# Patient Record
Sex: Female | Born: 1955 | Race: White | Hispanic: No | Smoking: Never smoker
Health system: Southern US, Community
[De-identification: ages and names within clinical notes are randomized; demographics above are authoritative.]

## PROBLEM LIST (undated history)

## (undated) DIAGNOSIS — Z780 Asymptomatic menopausal state: Secondary | ICD-10-CM

## (undated) DIAGNOSIS — M1990 Unspecified osteoarthritis, unspecified site: Secondary | ICD-10-CM

## (undated) DIAGNOSIS — E785 Hyperlipidemia, unspecified: Secondary | ICD-10-CM

## (undated) DIAGNOSIS — J349 Unspecified disorder of nose and nasal sinuses: Secondary | ICD-10-CM

## (undated) DIAGNOSIS — R112 Nausea with vomiting, unspecified: Secondary | ICD-10-CM

## (undated) DIAGNOSIS — Z9889 Other specified postprocedural states: Secondary | ICD-10-CM

## (undated) DIAGNOSIS — J302 Other seasonal allergic rhinitis: Secondary | ICD-10-CM

## (undated) DIAGNOSIS — R1011 Right upper quadrant pain: Secondary | ICD-10-CM

## (undated) HISTORY — DX: Unspecified osteoarthritis, unspecified site: M19.90

## (undated) HISTORY — DX: Morbid (severe) obesity due to excess calories: E66.01

## (undated) HISTORY — DX: Right upper quadrant pain: R10.11

## (undated) HISTORY — DX: Unspecified disorder of nose and nasal sinuses: J34.9

## (undated) HISTORY — PX: JOINT REPLACEMENT: SHX530

## (undated) HISTORY — DX: Asymptomatic menopausal state: Z78.0

## (undated) HISTORY — DX: Hyperlipidemia, unspecified: E78.5

## (undated) HISTORY — PX: TUBAL LIGATION: SHX77

## (undated) HISTORY — PX: TOTAL HIP ARTHROPLASTY: SHX124

---

## 2004-05-21 LAB — HM COLONOSCOPY: HM Colonoscopy: NORMAL

## 2005-04-06 ENCOUNTER — Ambulatory Visit: Payer: Self-pay | Admitting: Internal Medicine

## 2008-08-06 ENCOUNTER — Ambulatory Visit: Payer: Self-pay | Admitting: Family Medicine

## 2008-08-06 DIAGNOSIS — M79609 Pain in unspecified limb: Secondary | ICD-10-CM

## 2008-08-06 DIAGNOSIS — E785 Hyperlipidemia, unspecified: Secondary | ICD-10-CM

## 2008-08-06 DIAGNOSIS — M25559 Pain in unspecified hip: Secondary | ICD-10-CM

## 2008-08-06 LAB — CONVERTED CEMR LAB
Cholesterol, target level: 200 mg/dL
LDL Goal: 160 mg/dL

## 2008-10-02 LAB — HM MAMMOGRAPHY: HM Mammogram: NORMAL

## 2008-10-13 ENCOUNTER — Ambulatory Visit: Payer: Self-pay | Admitting: Family Medicine

## 2008-10-14 LAB — CONVERTED CEMR LAB
ALT: 26 units/L (ref 0–35)
AST: 22 units/L (ref 0–37)
Albumin: 4 g/dL (ref 3.5–5.2)
Alkaline Phosphatase: 63 units/L (ref 39–117)
Bilirubin, Direct: 0 mg/dL (ref 0.0–0.3)
CO2: 28 meq/L (ref 19–32)
Chloride: 112 meq/L (ref 96–112)
Cholesterol: 233 mg/dL — ABNORMAL HIGH (ref 0–200)
Creatinine, Ser: 0.9 mg/dL (ref 0.4–1.2)
GFR calc non Af Amer: 69.13 mL/min (ref >60)
Potassium: 4.2 meq/L (ref 3.5–5.1)
Sodium: 144 meq/L (ref 135–145)
Total Protein: 7.1 g/dL (ref 6.0–8.3)

## 2008-10-19 ENCOUNTER — Other Ambulatory Visit: Admission: RE | Admit: 2008-10-19 | Discharge: 2008-10-19 | Payer: Self-pay | Admitting: Family Medicine

## 2008-10-19 ENCOUNTER — Encounter: Payer: Self-pay | Admitting: Family Medicine

## 2008-10-19 ENCOUNTER — Ambulatory Visit: Payer: Self-pay | Admitting: Family Medicine

## 2008-10-20 ENCOUNTER — Encounter: Payer: Self-pay | Admitting: Family Medicine

## 2008-10-22 ENCOUNTER — Encounter (INDEPENDENT_AMBULATORY_CARE_PROVIDER_SITE_OTHER): Payer: Self-pay | Admitting: *Deleted

## 2008-11-02 ENCOUNTER — Encounter: Payer: Self-pay | Admitting: Family Medicine

## 2008-11-03 ENCOUNTER — Encounter: Payer: Self-pay | Admitting: Family Medicine

## 2008-11-08 ENCOUNTER — Encounter: Payer: Self-pay | Admitting: Family Medicine

## 2008-12-20 ENCOUNTER — Inpatient Hospital Stay (HOSPITAL_COMMUNITY): Admission: RE | Admit: 2008-12-20 | Discharge: 2008-12-22 | Payer: Self-pay | Admitting: Orthopedic Surgery

## 2009-02-01 ENCOUNTER — Ambulatory Visit: Payer: Self-pay | Admitting: Family Medicine

## 2009-02-03 LAB — CONVERTED CEMR LAB
Direct LDL: 159.5 mg/dL
HDL: 37.8 mg/dL — ABNORMAL LOW (ref >39.00)
Total CHOL/HDL Ratio: 6
Triglycerides: 127 mg/dL (ref 0.0–149.0)
VLDL: 25.4 mg/dL (ref 0.0–40.0)

## 2009-05-10 ENCOUNTER — Ambulatory Visit: Payer: Self-pay | Admitting: Family Medicine

## 2009-05-12 LAB — CONVERTED CEMR LAB
HDL: 40.8 mg/dL (ref >39.00)
Total CHOL/HDL Ratio: 4

## 2009-07-11 ENCOUNTER — Telehealth (INDEPENDENT_AMBULATORY_CARE_PROVIDER_SITE_OTHER): Payer: Self-pay | Admitting: *Deleted

## 2009-07-19 ENCOUNTER — Ambulatory Visit: Payer: Self-pay | Admitting: Family Medicine

## 2009-08-15 ENCOUNTER — Inpatient Hospital Stay (HOSPITAL_COMMUNITY): Admission: RE | Admit: 2009-08-15 | Discharge: 2009-08-17 | Payer: Self-pay | Admitting: Orthopedic Surgery

## 2009-09-25 ENCOUNTER — Ambulatory Visit: Payer: Self-pay | Admitting: Family Medicine

## 2009-10-21 ENCOUNTER — Ambulatory Visit: Payer: Self-pay | Admitting: Family Medicine

## 2009-10-21 LAB — CONVERTED CEMR LAB
ALT: 25 units/L (ref 0–35)
AST: 23 units/L (ref 0–37)
Albumin: 4.3 g/dL (ref 3.5–5.2)
Alkaline Phosphatase: 61 units/L (ref 39–117)
BUN: 20 mg/dL (ref 6–23)
Bilirubin, Direct: 0.2 mg/dL (ref 0.0–0.3)
CO2: 29 meq/L (ref 19–32)
Calcium: 9.3 mg/dL (ref 8.4–10.5)
Chloride: 107 meq/L (ref 96–112)
Cholesterol: 159 mg/dL (ref 0–200)
Creatinine, Ser: 0.8 mg/dL (ref 0.4–1.2)
GFR calc non Af Amer: 76.69 mL/min (ref >60)
Glucose, Bld: 90 mg/dL (ref 70–99)
HDL: 38 mg/dL — ABNORMAL LOW (ref >39.00)
LDL Cholesterol: 94 mg/dL (ref 0–99)
Potassium: 4.4 meq/L (ref 3.5–5.1)
Sodium: 143 meq/L (ref 135–145)
Total Bilirubin: 0.6 mg/dL (ref 0.3–1.2)
Total CHOL/HDL Ratio: 4
Total Protein: 6.8 g/dL (ref 6.0–8.3)
Triglycerides: 133 mg/dL (ref 0.0–149.0)
VLDL: 26.6 mg/dL (ref 0.0–40.0)

## 2009-10-26 ENCOUNTER — Ambulatory Visit: Payer: Self-pay | Admitting: Family Medicine

## 2009-10-26 DIAGNOSIS — M25569 Pain in unspecified knee: Secondary | ICD-10-CM

## 2009-10-26 DIAGNOSIS — M67919 Unspecified disorder of synovium and tendon, unspecified shoulder: Secondary | ICD-10-CM | POA: Insufficient documentation

## 2009-10-26 DIAGNOSIS — M719 Bursopathy, unspecified: Secondary | ICD-10-CM

## 2009-10-26 LAB — CONVERTED CEMR LAB

## 2009-10-26 LAB — HM PAP SMEAR

## 2010-05-10 ENCOUNTER — Ambulatory Visit: Payer: Self-pay | Admitting: Family Medicine

## 2010-05-10 DIAGNOSIS — R300 Dysuria: Secondary | ICD-10-CM | POA: Insufficient documentation

## 2010-05-10 DIAGNOSIS — N390 Urinary tract infection, site not specified: Secondary | ICD-10-CM

## 2010-05-10 LAB — CONVERTED CEMR LAB
Bilirubin Urine: NEGATIVE
Ketones, urine, test strip: NEGATIVE
Nitrite: NEGATIVE
Specific Gravity, Urine: 1.025
Urobilinogen, UA: 0.2
pH: 5

## 2010-05-11 ENCOUNTER — Encounter: Payer: Self-pay | Admitting: Family Medicine

## 2010-05-21 DIAGNOSIS — R1011 Right upper quadrant pain: Secondary | ICD-10-CM

## 2010-05-21 HISTORY — DX: Right upper quadrant pain: R10.11

## 2010-06-19 ENCOUNTER — Ambulatory Visit
Admission: RE | Admit: 2010-06-19 | Discharge: 2010-06-19 | Payer: Self-pay | Source: Home / Self Care | Attending: Family Medicine | Admitting: Family Medicine

## 2010-06-19 ENCOUNTER — Other Ambulatory Visit: Payer: Self-pay | Admitting: Family Medicine

## 2010-06-19 DIAGNOSIS — R1011 Right upper quadrant pain: Secondary | ICD-10-CM | POA: Insufficient documentation

## 2010-06-19 LAB — HEPATIC FUNCTION PANEL
Albumin: 4.2 g/dL (ref 3.5–5.2)
Alkaline Phosphatase: 65 U/L (ref 39–117)

## 2010-06-20 NOTE — Progress Notes (Signed)
Summary: needs another surgical clearance  Phone Note Call from Patient Call back at Home Phone [phone]   Caller: Patient Summary of Call: Pt states she had a hip replacement last summer and will be having another one at the end of march.  She will need another surgical clearance and is asking if she will need to come in for another appt.  She was last seen in 6/10. Initial call taken by: Lowella Petties CMA,  July 11, 2009 4:28 PM  Follow-up for Phone Call        yes given it has been 8 months since last OV she will need appt for preop eval ..Marland Kitchenplease have her schedule Follow-up by: Kerby Nora MD,  July 12, 2009 9:47 AM  Additional Follow-up for Phone Call Additional follow up Details #1::        Appt Scheduled Additional Follow-up by: Melody Comas,  July 12, 2009 10:33 AM

## 2010-06-20 NOTE — Assessment & Plan Note (Signed)
Summary: surgical clearance,wants flu shot/ alc   Vital Signs:  Patient profile:   55 year old female Height:      64 inches Weight:      249.8 pounds BMI:     43.03 Temp:     97.8 degrees F oral Pulse rate:   88 / minute Pulse rhythm:   regular BP sitting:   130 / 80  (left arm) Cuff size:   large  Vitals Entered By: Benny Lennert CMA Duncan Dull) (July 19, 2009 12:17 PM)  History of Present Illness: Chief complaint surgical clearence  Had right THR.Marland Kitchentolerated surgery well, inremarkable post op. Now has 3/28 surgery planned for left. Pain in right hip much better.  HAs gained weight, but unable to exercsie. Palns to work on this after surgery.   Problems Prior to Update: 1)  Routine Gynecological Examination  (ICD-V72.31) 2)  Physical Examination  (ICD-V70.0) 3)  Other Screening Mammogram  (ICD-V76.12) 4)  Hip Pain  (ICD-719.45) 5)  Heel Pain, Left  (ICD-729.5) 6)  Hyperlipidemia  (ICD-272.4)  Allergies (verified): No Known Drug Allergies  Past History:  Past medical, surgical, family and social histories (including risk factors) reviewed, and no changes noted (except as noted below).  Past Medical History: Reviewed history from 08/06/2008 and no changes required. Hyperlipidemia  Past Surgical History: Reviewed history from 08/06/2008 and no changes required. Denies surgical history  Family History: Reviewed history from 08/06/2008 and no changes required. father: lymphoma, lung cancer mother: high chol, osteoporosis brother: DM  MGM: colon cancer age 59s PGM: CAD  Social History: Reviewed history from 08/06/2008 and no changes required. 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 CV:  Denies chest pain or discomfort. Resp:   Denies shortness of breath. GI:  Denies abdominal pain, bloody stools, constipation, and diarrhea. GU:  Denies abnormal vaginal bleeding, discharge, and dysuria. Psych:  Denies anxiety and depression. Endo:  Denies cold intolerance, heat intolerance, and weight change.  Physical Exam  General:  obese appearing female in NAd Eyes:  No corneal or conjunctival inflammation noted. EOMI. Perrla. Funduscopic exam benign, without hemorrhages, exudates or papilledema. Vision grossly normal. Ears:  External ear exam shows no significant lesions or deformities.  Otoscopic examination reveals clear canals, tympanic membranes are intact bilaterally without bulging, retraction, inflammation or discharge. Hearing is grossly normal bilaterally. Nose:  External nasal examination shows no deformity or inflammation. Nasal mucosa are pink and moist without lesions or exudates. Mouth:  Oral mucosa and oropharynx without lesions or exudates.  Teeth in good repair. Small oropharynx Neck:  no carotid bruit or thyromegaly no cervical or supraclavicular lymphadenopathy  full ROM Lungs:  Normal respiratory effort, chest expands symmetrically. Lungs are clear to auscultation, no crackles or wheezes. Heart:  Normal rate and regular rhythm. S1 and S2 normal without gallop, murmur, click, rub or other extra sounds. Abdomen:  Bowel sounds positive,abdomen soft and non-tender without masses, organomegaly or hernias noted. Pulses:  R and L posterior tibial pulses are full and equal bilaterally  Extremities:  no edema  Skin:  Intact without suspicious lesions or rashes Psych:  Cognition and judgment appear intact. Alert and cooperative with normal attention span and concentration. No apparent delusions, illusions, hallucinations   Impression & Recommendations:  Problem # 1:  PREOPERATIVE EXAMINATION (ICD-V72.84)  High risk surgery in low risk pt. Weight and slow recovery time are her biggest risks. EKG and labs will be done  at hospital pre op. Tolerated last hip replacement well.   Problem # 2:  HYPERLIPIDEMIA (ICD-272.4) Improved.  Her updated medication list for this problem includes:    Simvastatin 40 Mg Tabs (Simvastatin) .Marland Kitchen... Take 1 tablet by mouth once a day  Complete Medication List: 1)  Multivitamins Tabs (Multiple vitamin) .... Take 1 tablet by mouth once a day 2)  Calcium Carbonate-vitamin D 600-400 Mg-unit Tabs (Calcium carbonate-vitamin d) .... Take 1 tablet by mouth once a day 3)  Vitamin C 500 Mg Tabs (Ascorbic acid) .... Take 1 tablet by mouth once a day 4)  Magnesium Gluconate 550 Mg Tabs (Magnesium gluconate) .... Take 1 tablet by mouth once a day 5)  Simvastatin 40 Mg Tabs (Simvastatin) .... Take 1 tablet by mouth once a day  Patient Instructions: 1)  Fasting lipids, CMET prior to CPX. Dx 272.0 2)  Scheduled CPX in 10/2009  Current Allergies (reviewed today): No known allergies   Appended Document: surgical clearance,wants flu shot/ alc .Flu Vaccine Consent Questions     Do you have a history of severe allergic reactions to this vaccine? no    Any prior history of allergic reactions to egg and/or gelatin? no    Do you have a sensitivity to the preservative Thimersol? no    Do you have a past history of Guillan-Barre Syndrome? no    Do you currently have an acute febrile illness? no    Have you ever had a severe reaction to latex? no    Vaccine information given and explained to patient? yes    Are you currently pregnant? no    Lot Number:[id number]   Exp Date:11/17/2009   Site Given  Left Deltoid IM    Clinical Lists Changes  Orders: Added new Service order of Admin 1st Vaccine (16109) - Signed Added new Service order of Flu Vaccine 73yrs + [phone]) - Signed Observations: Added new observation of FLU VAX VIS: 12/28/08 version (07/19/2009 14:55) Added new observation of FLU VAXLOT: [id number] (07/19/2009 14:55) Added new observation of FLU VAXMFR: [employer] (07/19/2009  14:55) Added new observation of FLU VAX EXP: 11/17/2009 (07/19/2009 14:55) Added new observation of FLU VAX DSE: 0.47ml (07/19/2009 14:55) Added new observation of FLU VAX: Fluvax 3+ (07/19/2009 14:55)

## 2010-06-20 NOTE — Assessment & Plan Note (Signed)
Summary: CPX /PAP???/RBH   Vital Signs:  Patient profile:   55 year old female Height:      64 inches Weight:      245.2 pounds BMI:     42.24 Temp:     97.8 degrees F oral Pulse rate:   88 / minute Pulse rhythm:   regular BP sitting:   120 / 86  (left arm) Cuff size:   large  Vitals Entered By: Benny Lennert CMA Duncan Dull) (October 26, 2009 11:12 AM)  History of Present Illness: Chief complaint cpx/pap  The patient is here for annual wellness exam and preventative care.     Has improved s/p B hip replacement.  Has completed rehab.  Sore in left medial knee x 3 weeks, using ice and ibuprofen. improving gradually. Wearing a neoprene brace intermittantly.  In last 3 months... sore in upper arm when lifting above head. Only note when moving arm.   Lipid Management History:      Positive NCEP/ATP III risk factors include female age 29 years old or older and HDL cholesterol less than 40.  Negative NCEP/ATP III risk factors include non-tobacco-user status.        Adjunctive measures started by the patient include aerobic exercise, fiber, ASA, folic acid, omega-3 supplements, limit alcohol consumpton, and weight reduction.  She expresses no side effects from her lipid-lowering medication.  The patient denies any symptoms to suggest myopathy or liver disease.     Problems Prior to Update: 1)  Preoperative Examination  (ICD-V72.84) 2)  Routine Gynecological Examination  (ICD-V72.31) 3)  Physical Examination  (ICD-V70.0) 4)  Other Screening Mammogram  (ICD-V76.12) 5)  Hip Pain  (ICD-719.45) 6)  Heel Pain, Left  (ICD-729.5) 7)  Hyperlipidemia  (ICD-272.4)  Current Medications (verified): 1)  Multivitamins   Tabs (Multiple Vitamin) .... Take 1 Tablet By Mouth Once A Day 2)  Calcium Carbonate-Vitamin D 600-400 Mg-Unit  Tabs (Calcium Carbonate-Vitamin D) .... Take 1 Tablet By Mouth Once A Day 3)  Vitamin C 500 Mg  Tabs (Ascorbic Acid) .... Take 1 Tablet By Mouth Once A Day 4)  Magnesium  Gluconate 550 Mg Tabs (Magnesium Gluconate) .... Take 1 Tablet By Mouth Once A Day 5)  Simvastatin 40 Mg Tabs (Simvastatin) .... Take 1 Tablet By Mouth Once A Day 6)  Fluticasone Propionate 50 Mcg/act Susp (Fluticasone Propionate) .Marland Kitchen.. 1 Sprays Per Nostril Daily 7)  Diclofenac Sodium 75 Mg Tbec (Diclofenac Sodium) .Marland Kitchen.. 1 Tab By Mouth Two Times A Day  Allergies (verified): No Known Drug Allergies  Past History:  Past medical, surgical, family and social histories (including risk factors) reviewed, and no changes noted (except as noted below).  Past Medical History: Reviewed history from 08/06/2008 and no changes required. Hyperlipidemia  Past Surgical History: Reviewed history from 08/06/2008 and no changes required. Denies surgical history  Family History: Reviewed history from 08/06/2008 and no changes required. father: lymphoma, lung cancer mother: high chol, osteoporosis brother: DM  MGM: colon cancer age 71s PGM: CAD  Social History: Reviewed history from 08/06/2008 and no changes required. 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 CV:  Denies chest pain or discomfort. Resp:  Denies shortness of breath. GI:  Denies abdominal pain, bloody stools, constipation, and diarrhea. GU:  Denies abnormal vaginal  bleeding and dysuria. Derm:  Denies lesion(s) and rash. Psych:  Denies anxiety and depression.  Physical Exam  General:  obese appearing female in NAD Eyes:  No corneal or conjunctival inflammation noted. EOMI. Perrla. Funduscopic exam benign, without hemorrhages, exudates or papilledema. Vision grossly normal. Ears:  External ear exam shows no significant lesions or deformities.  Otoscopic examination reveals clear canals,  tympanic membranes are intact bilaterally without bulging, retraction, inflammation or discharge. Hearing is grossly normal bilaterally. Nose:  External nasal examination shows no deformity or inflammation. Nasal mucosa are pink and moist without lesions or exudates. Mouth:  Oral mucosa and oropharynx without lesions or exudates.  Teeth in good repair. Neck:  no carotid bruit or thyromegaly no cervical or supraclavicular lymphadenopathy  Chest Wall:  No deformities, masses, or tenderness noted. Breasts:  No mass, nodules, thickening, tenderness, bulging, retraction, inflamation, nipple discharge or skin changes noted.   Lungs:  Normal respiratory effort, chest expands symmetrically. Lungs are clear to auscultation, no crackles or wheezes. Heart:  Normal rate and regular rhythm. S1 and S2 normal without gallop, murmur, click, rub or other extra sounds. Abdomen:  Bowel sounds positive,abdomen soft and non-tender without masses, organomegaly or hernias noted. Rectal:  no external abnormalities and no hemorrhoids.   Genitalia:  normal introitus, no external lesions, no vaginal discharge, mucosa pink and moist, no vaginal or cervical lesions, no vaginal atrophy, normal uterus size and position, and no adnexal masses or tenderness.   Msk:  ttp left lateral shoulder...over bursa, full strenght in RCM.Marland Kitchenneg impingement test left knee..ttp over medial joint line, neg ant/post drawer,intact  B collateral ligament ,  no crepitus, full ROM, no swelling, no redness Pulses:  R and L posterior tibial pulses are full and equal bilaterally  Extremities:  no edema Skin:  Intact without suspicious lesions or rashes   Impression & Recommendations:  Problem # 1:  PHYSICAL EXAMINATION (ICD-V70.0) The patient's preventative maintenance and recommended screening tests for an annual wellness exam were reviewed in full today. Brought up to date unless services declined.  Counselled on the importance of diet, exercise,  and its role in overall health and mortality. The patient's FH and SH was reviewed, including their home life, tobacco status, and drug and alcohol status.     Problem # 2:  ROUTINE GYNECOLOGICAL EXAMINATION (ICD-V72.31) No pap, nml DVE.   Problem # 3:  BURSITIS, LEFT SHOULDER (ICD-726.10) Treat with execises, ice, NSAIDs. Follow up if not improivng in 2 weeks or to [facility] as scheduled.   Problem # 4:  KNEE PAIN, LEFT (ICD-719.46) No clear ligament tear...possible medical collateral ligament strain vs osteoarthritis..Weight loss, NSAIDs, Stretching exercises.  Her updated medication list for this problem includes:    Diclofenac Sodium 75 Mg Tbec (Diclofenac sodium) .Marland Kitchen... 1 tab by mouth two times a day  Complete Medication List: 1)  Multivitamins Tabs (Multiple vitamin) .... Take 1 tablet by mouth once a day 2)  Calcium Carbonate-vitamin D 600-400 Mg-unit Tabs (Calcium carbonate-vitamin d) .... Take 1 tablet by mouth once a day 3)  Vitamin C 500 Mg Tabs (Ascorbic acid) .... Take 1 tablet by mouth once a day 4)  Magnesium Gluconate 550 Mg Tabs (Magnesium gluconate) .... Take 1 tablet by mouth once a day 5)  Simvastatin 40 Mg Tabs (Simvastatin) .... Take 1 tablet by mouth once a day 6)  Fluticasone Propionate 50 Mcg/act Susp (Fluticasone propionate) .Marland Kitchen.. 1 sprays per nostril daily 7)  Diclofenac Sodium 75 Mg Tbec (Diclofenac sodium) .Marland KitchenMarland KitchenMarland Kitchen 1  tab by mouth two times a day  Other Orders: Radiology Referral (Radiology)  Lipid Assessment/Plan:      Based on NCEP/ATP III, the patient's risk factor category is "2 or more risk factors and a calculated 10 year CAD risk of < 20%".  The patient's lipid goals are as follows: Total cholesterol goal is 200; LDL cholesterol goal is 160; HDL cholesterol goal is 40; Triglyceride goal is 150.  Her LDL cholesterol goal has been met.    Patient Instructions: 1)  Referral Appointment Information 2)  Day/Date: 3)  Time: 4)  Place/MD: 5)  Address: 6)   Phone/Fax: 7)  Patient given appointment information. Information/Orders faxed/mailed.  8)  Use diclofenac for shoulder and left knee. Gentle stretches. 9)  Follow up if not improving in 2 weeks.  Prescriptions: DICLOFENAC SODIUM 75 MG TBEC (DICLOFENAC SODIUM) 1 tab by mouth two times a day  #30 x 0   Entered and Authorized by:   Kerby Nora MD   Signed by:   Kerby Nora MD on 10/26/2009   Method used:   [facility] to        [facility]  [address] [phone]retail)       [address]       [place], [place]  [address]       Ph: [phone]       Fax: [phone]   RxID:   [id number]   Current Allergies (reviewed today): No known allergies   Colonoscopy Result Date:  05/21/2004 Colonoscopy Result:  normal Colonoscopy Next Due:  10 yr Last PAP:  NEGATIVE FOR INTRAEPITHELIAL LESIONS OR MALIGNANCY. (10/19/2008 12:00:00 AM) PAP Result Date:  10/26/2009 PAP Result:  DVE, pap q2 years PAP Next Due:  1 yr

## 2010-06-22 ENCOUNTER — Ambulatory Visit: Payer: Self-pay | Admitting: Family Medicine

## 2010-06-22 ENCOUNTER — Encounter: Payer: Self-pay | Admitting: Family Medicine

## 2010-06-22 NOTE — Assessment & Plan Note (Signed)
Summary: ?UTI/CLE   Vital Signs:  Patient profile:   55 year old female Height:      64 inches Weight:      239.0 pounds BMI:     41.17 Temp:     98.1 degrees F oral Pulse rate:   88 / minute Pulse rhythm:   regular BP sitting:   120 / 84  (left arm) Cuff size:   large  Vitals Entered By: Benny Lennert CMA Duncan Dull) (May 10, 2010 11:55 AM)  History of Present Illness: Chief complaint ? uti  burning, urgency with urination. no n/v/d, no flank pain  rare UTI  REVIEW OF SYSTEMS GEN: Acute illness details above. CV: No chest pain or SOB GI: No noted N or V Otherwise, pertinent positives and negatives are noted in the HPI.   GEN: WDWN, A&Ox4,NAD. Non-toxic HEENT: Atraumatic, normocephalic. CV: RRR, No M/G/R PULM: CTA B, No wheezes, crackles, or rhonchi ABD: S, NT, ND, +BS, no rebound. No CVAT. + suprapubic tenderness. EXT: No c/c/e  Allergies (verified): No Known Drug Allergies   Impression & Recommendations:  Problem # 1:  UTI (ICD-599.0) Assessment New  Her updated medication list for this problem includes:    Nitrofurantoin Monohyd Macro 100 Mg Caps (Nitrofurantoin monohyd macro) .Marland Kitchen... 1 by mouth two times a day  Encouraged to push clear liquids, get enough rest, and take acetaminophen as needed. To be seen in 10 days if no improvement, sooner if worse.  Orders: T-Culture, Urine ([id number])  Problem # 2:  DYSURIA (ICD-788.1)  Her updated medication list for this problem includes:    Nitrofurantoin Monohyd Macro 100 Mg Caps (Nitrofurantoin monohyd macro) .Marland Kitchen... 1 by mouth two times a day  Orders: UA Dipstick w/o Micro (manual) (41324)  Complete Medication List: 1)  Multivitamins Tabs (Multiple vitamin) .... Take 1 tablet by mouth once a day 2)  Calcium Carbonate-vitamin D 600-400 Mg-unit Tabs (Calcium carbonate-vitamin d) .... Take 1 tablet by mouth once a day 3)  Vitamin C 500 Mg Tabs (Ascorbic acid) .... Take 1 tablet by mouth once a day 4)   Magnesium Gluconate 550 Mg Tabs (Magnesium gluconate) .... Take 1 tablet by mouth once a day 5)  Simvastatin 40 Mg Tabs (Simvastatin) .... Take 1 tablet by mouth once a day 6)  Fluticasone Propionate 50 Mcg/act Susp (Fluticasone propionate) .Marland Kitchen.. 1 sprays per nostril daily 7)  Fish Oil 1000 Mg Caps (Omega-3 fatty acids) .... Take 2 tablets once daily 8)  Nitrofurantoin Monohyd Macro 100 Mg Caps (Nitrofurantoin monohyd macro) .Marland Kitchen.. 1 by mouth two times a day Prescriptions: NITROFURANTOIN MONOHYD MACRO 100 MG CAPS (NITROFURANTOIN MONOHYD MACRO) 1 by mouth two times a day  #14 x 0   Entered and Authorized by:   Hannah Beat MD   Signed by:   Hannah Beat MD on 05/10/2010   Method used:   [facility]        [phone][facility] (retail)       [address]       [place], [place]  [address]       Ph: [phone]       Fax: [phone]   RxID:   [id number]    Orders Added: 1)  UA Dipstick w/o Micro (manual) [81002] 2)  T-Culture, Urine [[id number]]    Current Allergies (reviewed today): No known allergies   Laboratory Results   Urine Tests  Date/Time Received: May 10, 2010 12:02  PM  Date/Time Reported: May 10, 2010 12:02 PM   Routine Urinalysis   Color: yellow Appearance: Cloudy Glucose: negative   (Normal Range: Negative) Bilirubin: negative   (Normal Range: Negative) Ketone: negative   (Normal Range: Negative) Spec. Gravity: 1.025   (Normal Range: 1.003-1.035) Blood: negative   (Normal Range: Negative) pH: 5.0   (Normal Range: 5.0-8.0) Protein: trace   (Normal Range: Negative) Urobilinogen: 0.2   (Normal Range: 0-1) Nitrite: negative   (Normal Range: Negative) Leukocyte Esterace: small   (Normal Range: Negative)

## 2010-06-28 NOTE — Assessment & Plan Note (Signed)
Summary: SWELLING AROUND RIBCAGE/CLE   Vital Signs:  Patient profile:   55 year old female Height:      64 inches Weight:      212 pounds BMI:     36.52 Temp:     97.6 degrees F oral Pulse rate:   84 / minute Pulse rhythm:   regular BP sitting:   144 / 80  (left arm) Cuff size:   large  Vitals Entered By: Delilah Shan CMA Tabbatha Bordelon Dull) (June 19, 2010 12:13 PM) CC: Swelling around rib cage   History of Present Illness: Tenderness under R anterior ribs. Inc in gas, sense of fullness after eating.  Feeling bloated.  Area is occ puffy.  She had some cramping in the area, intermittently.  Overall increase is symptoms in last few weeks.    No h/o abdominal surgery.  No FCNAVD.  Occ frequent stools in AM but no diarrhea.    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  Otherwise negative.    Physical Exam  General:  no apparent distress normocephalic atraumatic mucous membranes moist neck supple regular rate and rhythm clear to auscultation bilaterally abdomen soft, not tender to palpation except for pos murphy sign.  no rebound.  normal bs skin w/o jaundice.  ext well perfused.    Impression & Recommendations:  Problem # 1:  RUQ PAIN (ICD-789.01) Likely GB source.  Nontoxic.  Will  check labs and set up u/s.  If pos, refer to [facility].  If increase in symptoms in meantime, to ER.  Anatomy d/w patient.  She agrees, understands.  Okay for outpatient follow up.  Told to avoid fatty foods.  Orders: TLB-Hepatic/Liver Function Pnl (80076-HEPATIC) TLB-Lipase (83690-LIPASE) Radiology Referral (Radiology)  Complete Medication List: 1)  Multivitamins Tabs (Multiple vitamin) .... Take 1 tablet by mouth once a day 2)  Calcium Carbonate-vitamin D 600-400 Mg-unit Tabs (Calcium carbonate-vitamin d) .... Take 1 tablet by mouth once a day 3)  Vitamin C 500 Mg Tabs (Ascorbic acid) .... Take 1 tablet by mouth once a day 4)  Magnesium Gluconate 550 Mg Tabs (Magnesium gluconate) .... Take 1 tablet by mouth once a day 5)  Simvastatin 40 Mg Tabs (Simvastatin) .... Take 1 tablet by mouth once a day 6)  Fluticasone Propionate 50 Mcg/act Susp (Fluticasone propionate) .Marland Kitchen.. 1 sprays per nostril daily 7)  Fish Oil 1000 Mg Caps (Omega-3 fatty acids) .... Take 2 tablets once daily 8)  Vitamin D 1000 Unit Tabs (Cholecalciferol) .... Take 1 tablet by mouth once a day  Patient Instructions: 1)  You can get your results through our phone system.  Follow the instructions on the blue card.  2)  See Shirlee Limerick about your referral before your leave today.  3)  If your pain get much worse, then go to the ER. 4)  Avoid fatty foods in the meantime.  5)  Take care.    Orders Added: 1)  Est. Patient Level IV [46962] 2)  TLB-Hepatic/Liver Function Pnl [80076-HEPATIC] 3)  TLB-Lipase [83690-LIPASE] 4)  Radiology Referral [Radiology]    Current Allergies (reviewed today): No known allergies

## 2010-07-05 ENCOUNTER — Encounter: Payer: Self-pay | Admitting: Family Medicine

## 2010-07-05 ENCOUNTER — Ambulatory Visit: Payer: Self-pay | Admitting: Family Medicine

## 2010-08-14 LAB — URINALYSIS, ROUTINE W REFLEX MICROSCOPIC
Glucose, UA: NEGATIVE mg/dL
Hgb urine dipstick: NEGATIVE
Specific Gravity, Urine: 1.018 (ref 1.005–1.030)
pH: 7 (ref 5.0–8.0)

## 2010-08-14 LAB — BASIC METABOLIC PANEL
BUN: 13 mg/dL (ref 6–23)
CO2: 26 mEq/L (ref 19–32)
CO2: 27 mEq/L (ref 19–32)
Calcium: 8.1 mg/dL — ABNORMAL LOW (ref 8.4–10.5)
Calcium: 9.4 mg/dL (ref 8.4–10.5)
Chloride: 104 mEq/L (ref 96–112)
Creatinine, Ser: 0.73 mg/dL (ref 0.4–1.2)
Creatinine, Ser: 0.8 mg/dL (ref 0.4–1.2)
GFR calc Af Amer: >60 mL/min (ref >60)
GFR calc non Af Amer: >60 mL/min (ref >60)
Glucose, Bld: 119 mg/dL — ABNORMAL HIGH (ref 70–99)
Glucose, Bld: 92 mg/dL (ref 70–99)
Sodium: 140 mEq/L (ref 135–145)

## 2010-08-14 LAB — CBC
Hemoglobin: 11.3 g/dL — ABNORMAL LOW (ref 12.0–15.0)
Hemoglobin: 12.1 g/dL (ref 12.0–15.0)
MCHC: 35 g/dL (ref 30.0–36.0)
MCV: 91 fL (ref 78.0–100.0)
RBC: 3.67 MIL/uL — ABNORMAL LOW (ref 3.87–5.11)
RBC: 3.78 MIL/uL — ABNORMAL LOW (ref 3.87–5.11)
RBC: 4.9 MIL/uL (ref 3.87–5.11)
RDW: 13.2 % (ref 11.5–15.5)
WBC: 6.7 10*3/uL (ref 4.0–10.5)
WBC: 8.6 10*3/uL (ref 4.0–10.5)

## 2010-08-14 LAB — DIFFERENTIAL
Eosinophils Absolute: 0.2 10*3/uL (ref 0.0–0.7)
Eosinophils Relative: 4 % (ref 0–5)
Lymphocytes Relative: 38 % (ref 12–46)
Lymphs Abs: 2.5 10*3/uL (ref 0.7–4.0)
Monocytes Absolute: 0.5 10*3/uL (ref 0.1–1.0)

## 2010-08-14 LAB — URINE MICROSCOPIC-ADD ON

## 2010-08-14 LAB — TYPE AND SCREEN
ABO/RH(D): B POS
Antibody Screen: NEGATIVE

## 2010-08-26 LAB — BASIC METABOLIC PANEL
BUN: 7 mg/dL (ref 6–23)
BUN: 8 mg/dL (ref 6–23)
Calcium: 8.5 mg/dL (ref 8.4–10.5)
Chloride: 105 mEq/L (ref 96–112)
Chloride: 106 mEq/L (ref 96–112)
Creatinine, Ser: 0.69 mg/dL (ref 0.4–1.2)
Creatinine, Ser: 0.8 mg/dL (ref 0.4–1.2)
GFR calc Af Amer: >60 mL/min (ref >60)
GFR calc non Af Amer: >60 mL/min (ref >60)
Glucose, Bld: 124 mg/dL — ABNORMAL HIGH (ref 70–99)
Potassium: 4.5 mEq/L (ref 3.5–5.1)

## 2010-08-26 LAB — CBC
HCT: 35.1 % — ABNORMAL LOW (ref 36.0–46.0)
MCHC: 34.2 g/dL (ref 30.0–36.0)
MCV: 91.4 fL (ref 78.0–100.0)
MCV: 91.8 fL (ref 78.0–100.0)
Platelets: 178 10*3/uL (ref 150–400)
Platelets: 188 10*3/uL (ref 150–400)
RBC: 3.65 MIL/uL — ABNORMAL LOW (ref 3.87–5.11)
RDW: 14.1 % (ref 11.5–15.5)
WBC: 7.5 10*3/uL (ref 4.0–10.5)
WBC: 7.9 10*3/uL (ref 4.0–10.5)

## 2010-08-26 LAB — ABO/RH: ABO/RH(D): B POS

## 2010-08-26 LAB — TYPE AND SCREEN

## 2010-08-27 LAB — DIFFERENTIAL
Basophils Relative: 2 % — ABNORMAL HIGH (ref 0–1)
Eosinophils Absolute: 0.4 10*3/uL (ref 0.0–0.7)
Lymphs Abs: 2.1 10*3/uL (ref 0.7–4.0)
Monocytes Absolute: 0.4 10*3/uL (ref 0.1–1.0)
Monocytes Relative: 7 % (ref 3–12)
Neutro Abs: 2.6 10*3/uL (ref 1.7–7.7)
Neutrophils Relative %: 47 % (ref 43–77)

## 2010-08-27 LAB — BASIC METABOLIC PANEL
CO2: 25 mEq/L (ref 19–32)
Chloride: 112 mEq/L (ref 96–112)
Creatinine, Ser: 0.83 mg/dL (ref 0.4–1.2)
GFR calc Af Amer: >60 mL/min (ref >60)
Sodium: 142 mEq/L (ref 135–145)

## 2010-08-27 LAB — CBC
Hemoglobin: 14.2 g/dL (ref 12.0–15.0)
MCHC: 34.3 g/dL (ref 30.0–36.0)
MCV: 90.4 fL (ref 78.0–100.0)
RBC: 4.58 MIL/uL (ref 3.87–5.11)
WBC: 5.5 10*3/uL (ref 4.0–10.5)

## 2010-08-27 LAB — URINALYSIS, ROUTINE W REFLEX MICROSCOPIC
Nitrite: NEGATIVE
Specific Gravity, Urine: 1.022 (ref 1.005–1.030)
Urobilinogen, UA: 0.2 mg/dL (ref 0.0–1.0)
pH: 6.5 (ref 5.0–8.0)

## 2010-08-27 LAB — APTT: aPTT: 26 seconds (ref 24–37)

## 2010-08-27 LAB — PROTIME-INR: INR: 1 (ref 0.00–1.49)

## 2010-10-03 NOTE — H&P (Signed)
[name], Lowery                ACCOUNT NO.:  [id number]   MEDICAL RECORD NO.:  [id number]          PATIENT TYPE:  INP   LOCATION:                               FACILITY:  [facility]   PHYSICIAN:  Madlyn Frankel. Charlann Boxer, M.D.  DATE OF BIRTH:  1955-07-10   DATE OF ADMISSION:  12/20/2008  DATE OF DISCHARGE:                              HISTORY & PHYSICAL   PROCEDURE:  Right total hip replacement.   CHIEF COMPLAINTS:  Right hip pain.   HISTORY OF PRESENT ILLNESS:  A 55 year old female with a history of  right hip pain secondary to osteoarthritis.  It has been refractory to  all conservative treatment.   PRIMARY CARE PHYSICIAN:  Dr. Kerby Nora.   PAST MEDICAL HISTORY:  1. Osteoarthritis.  2. Dyslipidemia.   PREVIOUS SURGERIES:  None.   FAMILY HISTORY:  Cancer, lymphoma, osteoarthritis, heart disease.   SOCIAL HISTORY:  Divorced, retired, nonsmoker.  Has primary caregiver  postoperatively in the home.   DRUG ALLERGIES:  NO KNOWN DRUG ALLERGIES.   MEDICATIONS:  1. Multivitamin daily.  2. Calcium plus vitamin D p.o. daily.  3. Vitamin C 500 mg p.o. daily.  4. Magnesium 550 mg p.o. daily.  5. Diclofenac 75 mg one p.o. daily.   REVIEW OF SYSTEMS:  HEENT:  She has intermittent headaches.  GASTROINTESTINAL:  Heartburn.  MUSCULOSKELETAL:  She has morning  stiffness.  Otherwise see HPI.   PHYSICAL EXAMINATION:  VITAL SIGNS:  Pulse 72, respirations 16, blood  pressure 142/90.  GENERAL:  Awake, alert and oriented.  HEENT:  Normocephalic.  NECK:  Supple.  No carotid bruits.  CHEST:  Lungs clear to auscultation bilaterally.  BREASTS:  Deferred.  HEART:  S1-S2 distinct.  ABDOMEN:  Soft, nontender, bowel sounds present.  PELVIS:  Stable.  GENITOURINARY:  Deferred.  EXTREMITIES:  Right hip has decreased range of motion and increased pain  with weightbearing.  SKIN:  No cellulitis.  NEUROLOGIC:  Intact distal sensibilities.   LABORATORY DATA:  Labs, EKG, chest x-ray all pending  presurgical  testing.   IMPRESSION:  Right hip osteoarthritis.   PLAN OF ACTION:  Right total hip replacement by Dr. Charlann Boxer at [facility]  on December 20, 2008.  Risks and complications were discussed.  Postoperative medications were provided including aspirin for DVT  prophylaxis.     ______________________________  Yetta Glassman [address], [place]      Madlyn Frankel. Charlann Boxer, M.D.  Electronically Signed    BLM/MEDQ  D:  12/08/2008  T:  12/08/2008  Job:  [id number]   cc:   Kerby Nora, MD

## 2010-10-03 NOTE — Op Note (Signed)
NAMEONYINYECHI, HUANTE                ACCOUNT NO.:  [id number]   MEDICAL RECORD NO.:  [id number]          PATIENT TYPE:  INP   LOCATION:  0004                         FACILITY:  [facility]   PHYSICIAN:  Madlyn Frankel. Charlann Boxer, M.D.  DATE OF BIRTH:  September 09, 1955   DATE OF PROCEDURE:  12/20/2008  DATE OF DISCHARGE:                               OPERATIVE REPORT   PREOPERATIVE DIAGNOSIS:  Right hip osteoarthritis.   POSTOPERATIVE DIAGNOSIS:  Right hip osteoarthritis.   PROCEDURE:  Right total hip replacement.   COMPONENTS USED:  DePuy hip system, size 50 Pinnacle cup, a size 3 high  Tri-Lock stem with a 36, 1.5 ball with a metal-on-metal system.   SURGEON:  Madlyn Frankel. Charlann Boxer, M.D.   ASSISTANT:  Yetta Glassman. Mann, PA.   ANESTHESIA:  Spinal.   SPECIMENS:  None.   COMPLICATIONS:  None.   DRAINS:  One Hemovac.   ESTIMATED BLOOD LOSS:  Approximately 400 mL.   INDICATIONS FOR PROCEDURE:  Ms. Swann is a 55 year old female who  presented to the office for hip complaints.  Radiographs from the  outside indicated end-stage bone-on-bone arthritis of the right hip and  moderate degenerative changes of the left hip.   She had a limited range of motion, decreased quality of life based on  her limited functional ability with the pain.  Based on her radiographic  appearance, I did feel that any conservative measures would be of any  benefit for her longterm.  We discussed hip replacement surgery.  The  risk of infection, DVT, component failure, dislocation were all  discussed and reviewed.  Consent was obtained for the benefit of pain  relief.   PROCEDURE IN DETAIL:  The patient was brought to the operative theater.  Once adequate anesthesia, preoperative antibiotics, Ancef administered  the patient was positioned in the left lateral decubitus position with  her right-side up.  The right lower extremity was then pre-scrubbed,  prepped and draped in sterile fashion.   The time-out was performed  identifying the patient, planned procedure  and the extremity.  The lateral based incision was made for a posterior  approach to the hip.  Sharp dissection was carried down to the  iliotibial band and gluteal fascia.  The iliotibial band was split  posteriorly for posterior approach.  The short external rotators and  posterior capsule were taken down as a single layer.  The hip was  dislocated.  A neck osteotomy was made based off anatomic landmarks and  evaluating preoperative radiographs.  I attended to the femur first,  opening the proximal canal with a box osteotome to assure that was  lateral enough on the neck and then used a drill and then the hand  reamer once.  I then irrigated to prevent fat emboli.  I began broaching  with zero broach setting the femoral anteversion about 20 degrees.  I  broached up to a size 2 and then used a calcar planer to finish off the  neck cut and then went to a 3.  The 3 broach sat about a mm proud of the  neck cut.  With this, I went ahead and attended to the acetabulum.  Acetabular exposure was obtained.  The labrum was removed.  I began  reaming with a 45 reamer and reamed up to 49 reamer with good bony bed  preparation.  The final 50 Pinnacle cup was the impacted and it was  impacted at approximately 35-48 of abduction and 20 degrees of full  flexion, anatomically positioned inferior to the anterior rim of the  acetabulum, with a portion of the cup exposed superior lateral.   Given these parameters a single cancellous screw was placed and the  trial liner placed.  Trial reduction was now carried out and 36, +1.5,  the 3 broach and high neck based on the radiographs.  The leg lengths  appeared to be comparable to the down leg when I positioned the patient.  There was no evidence of impingement up to 70- 80 degrees of internal  rotation with neutral abduction and flexion to 80 degrees.  Given these  parameters, all trial components were removed and I  placed a central  hole eliminator.  The final 36 metal liner was impacted onto a dried  shell.   The final 3 high Tri-Lock stem was then opened.  It was impacted.  It  sat at about the level where the broach was and based on this a trial  reduction of 36, 1.5 ball was utilized.  This was impacted onto clean  and dry trunnion.  The hip was re-reduced and the hip had irrigated  throughout the case and again at this point.  I then reapproximated the  posterior capsule and short external rotator tissue with #1 Vicryl back  to the superior capsular tissues.  Then placed a medium Hemovac drain  deep.  The remainder of the wound was closed in layers with a #1 Vicryl  in the iliotibial band and gluteal fascia, 2-0 in the subcutaneous layer  and 4-0 running Monocryl.  The hip was cleaned, dried and dressed  sterilely with Steri-Strips and a sterile wrap.  She was brought to the  recovery room in stable condition.      Madlyn Frankel Charlann Boxer, M.D.  Electronically Signed     MDO/MEDQ  D:  12/20/2008  T:  12/20/2008  Job:  [id number]

## 2010-10-06 NOTE — Discharge Summary (Signed)
Elizabeth Lowery, Elizabeth Lowery                ACCOUNT NO.:  [id number]   MEDICAL RECORD NO.:  [id number]          PATIENT TYPE:  INP   LOCATION:  1620                         FACILITY:  [facility]   PHYSICIAN:  Madlyn Frankel. Charlann Boxer, M.D.  DATE OF BIRTH:  August 12, 1955   DATE OF ADMISSION:  12/20/2008  DATE OF DISCHARGE:  12/22/2008                               DISCHARGE SUMMARY   ADMITTING DIAGNOSES:  1. Osteoarthritis.  2. Dyslipidemia.   DISCHARGE DIAGNOSES:  1. Osteoarthritis.  2. Dyslipidemia.   HISTORY OF PRESENT ILLNESS:  A 55 year old female with a history of  right hip pain secondary to osteoarthritis refractory to all  conservative treatment.   CONSULTS:  None.   PROCEDURE:  A right total hip replacement by surgeon, Dr. Durene Romans.  Assistant, Dwyane Luo, P.A.-C.   LABORATORY DATA:  CBC, final reading, white blood cells 7.9, hemoglobin  11.4, hematocrit 33.5, platelets 178.  Metabolic, sodium 130, potassium  4.4, BUN 7, creatinine 0.69, glucose 122.   HOSPITAL COURSE:  Patient admitted to the hospital then underwent a  right total hip replacement, tolerated procedure well, and was admitted  to the orthopedic floor.  Her stay was unremarkable.  She remained  hemodynamically and orthopedically stable throughout her course of stay.  Day two was seen and was stable, afebrile, hemodynamically stable.  Dressing was clean, dry, with no significant drainage from the wound.  She was neurovascularly intact to right lower extremity.  She was  weightbearing as tolerated and making progress with her physical  therapy.  Dressing was changed and she was ready for discharge home.   DISCHARGE DISPOSITION:  Discharged home in stable and improved  condition.   DISCHARGE PHYSICAL THERAPY:  Weightbearing as tolerated with use of  rolling walker.   DISCHARGE DIET:  Heart healthy.   DISCHARGE WOUND CARE:  Keep dry.   DISCHARGE MEDICATIONS:  1. Aspirin 325 mg one p.o. b.i.d. x6 weeks.  2. Robaxin 500  mg one p.o. q.6.  3. Iron 325 mg p.o. t.i.d. x2 weeks.  4. Colace 100 mg p.o. b.i.d.  5. MiraLax 17 g p.o. daily.  6. Norco 7.5/325 one to two p.o. q.4 to 6 p.r.n. pain.  7. Magnesium 250 mg one p.o. daily.  8. Calcium plus vitamin D one p.o. daily.  9. Omega-3 hold 2 weeks.  10.Multivitamin daily.   DISCHARGE FOLLOWUP:  Follow with Dr. Charlann Boxer, phone number [phone], in 2  weeks for wound check.     ______________________________  Yetta Glassman. [address], [place]      Madlyn Frankel. Charlann Boxer, M.D.  Electronically Signed    BLM/MEDQ  D:  12/28/2008  T:  12/28/2008  Job:  [id number]   cc:   Kerby Nora, MD

## 2010-11-24 ENCOUNTER — Telehealth: Payer: Self-pay | Admitting: Family Medicine

## 2010-11-24 DIAGNOSIS — E785 Hyperlipidemia, unspecified: Secondary | ICD-10-CM

## 2010-11-24 NOTE — Telephone Encounter (Signed)
Message copied by Kerby Nora E on Fri Nov 24, 2010  3:00 PM ------      Message from: Baldomero Lamy      Created: Thu Nov 23, 2010 10:16 AM      Regarding: cpx labs mon       Please order  future cpx labs for pt's upcomming lab appt.      Thanks      Rodney Booze

## 2010-11-25 ENCOUNTER — Encounter: Payer: Self-pay | Admitting: Family Medicine

## 2010-11-27 ENCOUNTER — Other Ambulatory Visit (INDEPENDENT_AMBULATORY_CARE_PROVIDER_SITE_OTHER): Payer: BC Managed Care – PPO | Admitting: Family Medicine

## 2010-11-27 DIAGNOSIS — E785 Hyperlipidemia, unspecified: Secondary | ICD-10-CM

## 2010-11-27 LAB — COMPREHENSIVE METABOLIC PANEL
ALT: 41 U/L — ABNORMAL HIGH (ref 0–35)
AST: 29 U/L (ref 0–37)
Alkaline Phosphatase: 56 U/L (ref 39–117)
BUN: 20 mg/dL (ref 6–23)
Creatinine, Ser: 0.9 mg/dL (ref 0.4–1.2)
Total Bilirubin: 0.5 mg/dL (ref 0.3–1.2)

## 2010-11-27 LAB — LIPID PANEL
HDL: 45.2 mg/dL (ref >39.00)
LDL Cholesterol: 112 mg/dL — ABNORMAL HIGH (ref 0–99)
Total CHOL/HDL Ratio: 4
Triglycerides: 133 mg/dL (ref 0.0–149.0)
VLDL: 26.6 mg/dL (ref 0.0–40.0)

## 2010-12-01 ENCOUNTER — Other Ambulatory Visit (HOSPITAL_COMMUNITY)
Admission: RE | Admit: 2010-12-01 | Discharge: 2010-12-01 | Disposition: A | Discharge status: Home / Self Care | Payer: BC Managed Care – PPO | Source: Ambulatory Visit | Attending: Family Medicine | Admitting: Family Medicine

## 2010-12-01 ENCOUNTER — Ambulatory Visit (INDEPENDENT_AMBULATORY_CARE_PROVIDER_SITE_OTHER): Payer: BC Managed Care – PPO | Admitting: Family Medicine

## 2010-12-01 ENCOUNTER — Encounter: Payer: Self-pay | Admitting: Family Medicine

## 2010-12-01 DIAGNOSIS — Z01419 Encounter for gynecological examination (general) (routine) without abnormal findings: Secondary | ICD-10-CM | POA: Insufficient documentation

## 2010-12-01 DIAGNOSIS — Z1159 Encounter for screening for other viral diseases: Secondary | ICD-10-CM | POA: Insufficient documentation

## 2010-12-01 DIAGNOSIS — Z1231 Encounter for screening mammogram for malignant neoplasm of breast: Secondary | ICD-10-CM

## 2010-12-01 DIAGNOSIS — Z0000 Encounter for general adult medical examination without abnormal findings: Secondary | ICD-10-CM

## 2010-12-01 DIAGNOSIS — K760 Fatty (change of) liver, not elsewhere classified: Secondary | ICD-10-CM | POA: Insufficient documentation

## 2010-12-01 DIAGNOSIS — K7689 Other specified diseases of liver: Secondary | ICD-10-CM

## 2010-12-01 DIAGNOSIS — E785 Hyperlipidemia, unspecified: Secondary | ICD-10-CM

## 2010-12-01 NOTE — Assessment & Plan Note (Signed)
Well controlled. Continue current medication. Encouraged exercise, weight loss, healthy eating habits.  

## 2010-12-01 NOTE — Assessment & Plan Note (Signed)
Seen on [place], mild elevation of liver function tests. Discussed exercsie, weight loss and low fat diet.

## 2010-12-01 NOTE — Progress Notes (Signed)
Subjective:    Patient ID: Elizabeth Lowery, female    DOB: 19-Jun-1955, 55 y.o.   MRN: [id number]  HPI  The patient is here for annual wellness exam and preventative care.    Elevated Cholesterol: Well controlled on simvastatin. LDL <130. Using medications without problems:Yes. Muscle aches: None Other complaints:  Did have eval for gallstones on last year... Normal. Minimal abdominal pain, only with fatty foods.  Considering lap band surgery.Micah Flesher to seminar at [facility]. She is interested in moving forward. She will get me the forms in later this month.    Review of Systems  Constitutional: Negative for fever and fatigue.  HENT: Negative for ear pain.   Eyes: Negative for pain.  Respiratory: Negative for shortness of breath and wheezing.   Cardiovascular: Negative for chest pain, palpitations and leg swelling.  Gastrointestinal: Negative for diarrhea, constipation, blood in stool and abdominal distention.  Genitourinary: Negative for dysuria, vaginal bleeding, vaginal discharge, vaginal pain and pelvic pain.  Skin: Negative for rash.  Psychiatric/Behavioral: Negative for dysphoric mood and agitation.       Objective:   Physical Exam  Constitutional: Vital signs are normal. She appears well-developed and well-nourished. She is cooperative.  Non-toxic appearance. She does not appear ill. No distress.  HENT:  Head: Normocephalic.  Right Ear: Hearing, tympanic membrane, external ear and ear canal normal.  Left Ear: Hearing, tympanic membrane, external ear and ear canal normal.  Nose: Nose normal.  Eyes: Conjunctivae, EOM and lids are normal. Pupils are equal, round, and reactive to light. No foreign bodies found.  Neck: Trachea normal and normal range of motion. Neck supple. Carotid bruit is not present. No mass and no thyromegaly present.  Cardiovascular: Normal rate, regular rhythm, S1 normal, S2 normal, normal heart sounds and intact distal pulses.  Exam reveals no gallop.    No murmur heard. Pulmonary/Chest: Effort normal and breath sounds normal. No respiratory distress. She has no wheezes. She has no rhonchi. She has no rales.  Abdominal: Soft. Normal appearance and bowel sounds are normal. She exhibits no distension, no fluid wave, no abdominal bruit and no mass. There is no hepatosplenomegaly. There is no tenderness. There is no rebound, no guarding and no CVA tenderness. No hernia.  Genitourinary: Vagina normal and uterus normal. No breast swelling, tenderness, discharge or bleeding. Pelvic exam was performed with patient prone. There is no rash, tenderness or lesion on the right labia. There is no rash, tenderness or lesion on the left labia. Uterus is not enlarged and not tender. Cervix exhibits no motion tenderness, no discharge and no friability. Right adnexum displays no mass, no tenderness and no fullness. Left adnexum displays no mass, no tenderness and no fullness.  Lymphadenopathy:    She has no cervical adenopathy.    She has no axillary adenopathy.  Neurological: She is alert. She has normal strength. No cranial nerve deficit or sensory deficit.  Skin: Skin is warm, dry and intact. No rash noted.  Psychiatric: Her speech is normal and behavior is normal. Judgment normal. Her mood appears not anxious. Cognition and memory are normal. She does not exhibit a depressed mood.          Assessment & Plan:  Complete Physical Exam: The patient's preventative maintenance and recommended screening tests for an annual wellness exam were reviewed in full today. Brought up to date unless services declined.  Counselled on the importance of diet, exercise, and its role in overall health and mortality. The patient's FH and  SH was reviewed, including their home life, tobacco status, and drug and alcohol status.   PAP and DVE this year then every 3 years following given normals  >3 in a row. MAmmo scheduled.  Colon due  In 2016. Vaccines Up to Date.

## 2010-12-01 NOTE — Patient Instructions (Addendum)
Work on exercise weight loss and low fat diet.  Stop to speak with Shirlee Limerick on the way out. Bring in any bariatric surgery paperwork you need [place] to complete.

## 2010-12-06 ENCOUNTER — Telehealth: Payer: Self-pay | Admitting: *Deleted

## 2010-12-06 NOTE — Telephone Encounter (Signed)
Pt has dropped off information regarding bariatric surgery, this is on your desk.

## 2010-12-11 ENCOUNTER — Encounter: Payer: Self-pay | Admitting: Family Medicine

## 2010-12-12 ENCOUNTER — Encounter: Payer: Self-pay | Admitting: Family Medicine

## 2010-12-12 DIAGNOSIS — Z0279 Encounter for issue of other medical certificate: Secondary | ICD-10-CM

## 2011-01-25 ENCOUNTER — Ambulatory Visit (INDEPENDENT_AMBULATORY_CARE_PROVIDER_SITE_OTHER): Payer: BC Managed Care – PPO | Admitting: Surgery

## 2011-01-25 ENCOUNTER — Other Ambulatory Visit (INDEPENDENT_AMBULATORY_CARE_PROVIDER_SITE_OTHER): Payer: Self-pay | Admitting: Surgery

## 2011-01-25 ENCOUNTER — Encounter (INDEPENDENT_AMBULATORY_CARE_PROVIDER_SITE_OTHER): Payer: Self-pay | Admitting: Surgery

## 2011-01-25 NOTE — Patient Instructions (Signed)
Plan evaluation with labs, x-rays, nutrition, and psych eval.

## 2011-01-25 NOTE — Progress Notes (Addendum)
ASSESSMENT AND PLAN: 1.  Morbid Obesity   Per the 1991 NIH Consensus Statement, the patient is a candidate for bariatric surgery.  The patient attended our information session and reviewed the different types of bariatric surgery.    The patient is interested in the laparoscopic adjustable gastric band.  I discussed with the patient the indications and risks of lap band surgery.  The potential risks of surgery include, but are not limited to, bleeding, infection, DVT and PE, slippage and erosion of the band, open surgery, and death.  The patient understands the importance of compliance and long term follow-up with our group after surgery.  She was given literature regarding lap band surgery and encouraged to visit their web site, [email or web address], and register.  Discussed with patient the preop evaluation, including labs, x-rays, nutrition eval, and psych eval.  2.  History of both hips replaced.  Dr. Charlann Boxer.  2.  Hypercholesterolemia.  4.  Negative GB eval February 2011.  Chief Complaint  Patient presents with  . Pre-op Exam    Initial bariatric    HISTORY OF PRESENT ILLNESS: Elizabeth Lowery is a 55 y.o. (DOB: 06-Jun-1955)  white female who is a patient of Elizabeth Nora, MD, MD and comes to me today for weight loss surgery.  The patient heard me speak at an information session. She has a friend, Elizabeth Lowery, who had a lap band surgery through our office and has done well.  She has tried multiple diets in her life. Her weight was 145 until she turned 52 and had her daughter. Since then she's steadily gained weight.  She's tried Weight Watchers, bariatric weight loss, [place] weight loss, TOPS, physician weight loss, and her own low-calorie diet. She's tried over-the-counter medicines to lose weight and she tried prescription for Phenterimine.  She's had no history of peptic ulcer disease, stomach disease, liver disease, pancreas disease, or colon disease. In February 2011, she underwent a evaluation for  gallbladder disease, including an ultrasound and HIDA scan. These exams were normal. She has watched her diet and her symptoms have improved. She underwent a negative colonoscopy in 2006.   Past Medical History  Diagnosis Date  . Hyperlipidemia   . RUQ pain 2012    ruq pain with fatty lover of u/s, no gallstones and normal HIDA scan  . Arthritis     Past Surgical History  Procedure Date  . Total hip arthroplasty 2010, 2012    Bilateral  . Joint replacement 2010, 2011    right on 12/2008, left on 07/2009    Current Outpatient Prescriptions  Medication Sig Dispense Refill  . Calcium Carb-Cholecalciferol 600-400 MG-UNIT TABS Take 1 tablet by mouth daily.        . cholecalciferol (VITAMIN D) 1000 UNITS tablet Take 1,000 Units by mouth daily.        . fish oil-omega-3 fatty acids 1000 MG capsule Take 2 g by mouth daily.        . fluticasone (FLONASE) 50 MCG/ACT nasal spray Place 1 spray into the nose daily.        . Magnesium Gluconate 550 MG TABS Take 1 tablet by mouth daily.        . Multiple Vitamin (MULTIVITAMIN) tablet Take 1 tablet by mouth daily.        . simvastatin (ZOCOR) 40 MG tablet Take 40 mg by mouth at bedtime.        . vitamin C (ASCORBIC ACID) 500 MG tablet Take 500 mg by mouth daily.  REVIEW OF SYSTEMS: Skin:  No history of rash.  No history of abnormal moles. Infection:  No history of hepatitis or HIV.  No history of MRSA. Neurologic:  No history of stroke.  No history of seizure.  No history of headaches. Cardiac:  No history of hypertension. No history of heart disease.  No history of prior cardiac catheterization.  No history of seeing a cardiologist. Pulmonary:  Does not smoke cigarettes.  No asthma or bronchitis.  No OSA/CPAP.  Endocrine:  No diabetes. No thyroid disease.  Hypercholesterolemia x 2 years. Gastrointestinal:  See HPI. Urologic:  No history of kidney stones.  No history of bladder infections. Musculoskeletal:  History of bilateral hip  replacement in 2010 and 2011. Hematologic:  No bleeding disorder.  No history of anemia.  Not anticoagulated.   No Known Allergies  SOCIAL and FAMILY HISTORY: Single. 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  But wants to [place] to [place] [facility] to be near daughter. (who has her grandchild) Retired from [employer] in 2008.  PHYSICAL EXAM: BP 152/94  Pulse 68  Temp(Src) 96.8 F (36 C) (Temporal)  Resp 12  Ht 5\' 4"  (1.626 m)  Wt 255 lb (115.667 kg)  BMI 43.77 kg/m2  HEENT: Normal. Pupils equal. Normal dentition. Neck: Supple. No thyroid mass. Lymph Nodes:  No supraclavicular or cervical nodes. Lungs: Clear and symmetric. Heart:  RRR. No murmur.  Breasts:  Tattoo right breast - rose.  No mass. Abdomen: No mass. No tenderness. No hernia. Normal bowel sounds.  No abdominal scars.  More apple than pear in shape. Rectal: No mass, guiac negative. Extremities:  Good strength in upper and lower extremities. Neurologic:  Grossly intact to motor and sensory function.   DATA REVIEWED: [UGI - negative   DN 02/05/2011] {US - hepatic steatosis    DN 02/05/2011]

## 2011-02-05 ENCOUNTER — Ambulatory Visit (HOSPITAL_COMMUNITY)
Admission: RE | Admit: 2011-02-05 | Discharge: 2011-02-05 | Disposition: A | Discharge status: Home / Self Care | Payer: BC Managed Care – PPO | Source: Ambulatory Visit | Attending: Surgery | Admitting: Surgery

## 2011-02-05 ENCOUNTER — Other Ambulatory Visit: Payer: Self-pay

## 2011-02-05 DIAGNOSIS — M129 Arthropathy, unspecified: Secondary | ICD-10-CM | POA: Insufficient documentation

## 2011-02-05 DIAGNOSIS — K7689 Other specified diseases of liver: Secondary | ICD-10-CM | POA: Insufficient documentation

## 2011-02-05 DIAGNOSIS — Z6841 Body Mass Index (BMI) 40.0 and over, adult: Secondary | ICD-10-CM | POA: Insufficient documentation

## 2011-02-05 DIAGNOSIS — E7800 Pure hypercholesterolemia, unspecified: Secondary | ICD-10-CM | POA: Insufficient documentation

## 2011-02-07 ENCOUNTER — Other Ambulatory Visit (INDEPENDENT_AMBULATORY_CARE_PROVIDER_SITE_OTHER): Payer: Self-pay | Admitting: Surgery

## 2011-02-07 ENCOUNTER — Encounter: Payer: BC Managed Care – PPO | Attending: Surgery | Admitting: *Deleted

## 2011-02-07 ENCOUNTER — Other Ambulatory Visit: Payer: Self-pay | Admitting: Family Medicine

## 2011-02-07 ENCOUNTER — Encounter: Payer: Self-pay | Admitting: *Deleted

## 2011-02-07 DIAGNOSIS — Z713 Dietary counseling and surveillance: Secondary | ICD-10-CM | POA: Insufficient documentation

## 2011-02-07 DIAGNOSIS — Z09 Encounter for follow-up examination after completed treatment for conditions other than malignant neoplasm: Secondary | ICD-10-CM | POA: Insufficient documentation

## 2011-02-07 DIAGNOSIS — Z9884 Bariatric surgery status: Secondary | ICD-10-CM | POA: Insufficient documentation

## 2011-02-07 LAB — COMPREHENSIVE METABOLIC PANEL
AST: 19 U/L (ref 0–37)
Albumin: 4.2 g/dL (ref 3.5–5.2)
Alkaline Phosphatase: 66 U/L (ref 39–117)
BUN: 17 mg/dL (ref 6–23)
Creat: 0.95 mg/dL (ref 0.50–1.10)
Potassium: 4.5 mEq/L (ref 3.5–5.3)

## 2011-02-07 LAB — CBC WITH DIFFERENTIAL/PLATELET
Basophils Absolute: 0 10*3/uL (ref 0.0–0.1)
Basophils Relative: 1 % (ref 0–1)
HCT: 43.6 % (ref 36.0–46.0)
MCHC: 32.6 g/dL (ref 30.0–36.0)
Monocytes Absolute: 0.3 10*3/uL (ref 0.1–1.0)
Neutro Abs: 2.8 10*3/uL (ref 1.7–7.7)
Platelets: 212 10*3/uL (ref 150–400)
RDW: 13.6 % (ref 11.5–15.5)

## 2011-02-07 LAB — TSH: TSH: 2.28 u[IU]/mL (ref 0.350–4.500)

## 2011-02-07 NOTE — Progress Notes (Signed)
  Patient was seen on 02/07/2011 for Pre-Operative LAGB Nutrition Assessment. Assessment and letter of approval faxed to [facility] Program coordinator on 02/07/2011.    Handouts given during visit include:  Pre-Op Goals Handout  Patient to call for Pre-Op and Post-Op Nutrition Education at the [facility] when surgery is scheduled.

## 2011-02-07 NOTE — Patient Instructions (Signed)
   Follow Pre-Op Nutrition Goals to prepare for LAGB Surgery.   Call the [facility] at [phone] once you have been given your surgery date to enrolled in the Pre-Op Nutrition Class. You will need to attend this nutrition class 3-4 weeks prior to your surgery. 

## 2011-02-09 ENCOUNTER — Ambulatory Visit (HOSPITAL_COMMUNITY)
Admission: RE | Admit: 2011-02-09 | Discharge: 2011-02-09 | Disposition: A | Discharge status: Home / Self Care | Payer: BC Managed Care – PPO | Source: Ambulatory Visit | Attending: Surgery | Admitting: Surgery

## 2011-03-07 ENCOUNTER — Ambulatory Visit (INDEPENDENT_AMBULATORY_CARE_PROVIDER_SITE_OTHER): Payer: BC Managed Care – PPO

## 2011-03-07 DIAGNOSIS — Z23 Encounter for immunization: Secondary | ICD-10-CM

## 2011-03-26 ENCOUNTER — Other Ambulatory Visit (INDEPENDENT_AMBULATORY_CARE_PROVIDER_SITE_OTHER): Payer: Self-pay | Admitting: Surgery

## 2011-03-27 ENCOUNTER — Other Ambulatory Visit (INDEPENDENT_AMBULATORY_CARE_PROVIDER_SITE_OTHER): Payer: Self-pay | Admitting: Obstetrics and Gynecology

## 2011-04-04 ENCOUNTER — Encounter: Payer: Self-pay | Admitting: *Deleted

## 2011-04-04 ENCOUNTER — Encounter: Payer: BC Managed Care – PPO | Attending: Surgery | Admitting: *Deleted

## 2011-04-04 DIAGNOSIS — Z713 Dietary counseling and surveillance: Secondary | ICD-10-CM | POA: Insufficient documentation

## 2011-04-04 DIAGNOSIS — Z9884 Bariatric surgery status: Secondary | ICD-10-CM | POA: Insufficient documentation

## 2011-04-04 DIAGNOSIS — Z09 Encounter for follow-up examination after completed treatment for conditions other than malignant neoplasm: Secondary | ICD-10-CM | POA: Insufficient documentation

## 2011-04-04 NOTE — Progress Notes (Signed)
  Bariatric Class:  Appt start time: 1100 end time:  1200.  Pre-Operative Nutrition Class  Patient was seen on 04/04/2011 for Pre-Operative Nutrition education at the [facility].   Surgery date: 04/24/11 Surgery type: LAGB  Weight today: 258.4lb Weight change: 5.2 lb gain Total weight lost: N/A BMI: 44.4  Samples given per MNT protocol: Bariatric Advantage Multivitamin Lot # [id number] Exp: 9/13  Bariatric Advantage Calcium Citrate Lot # [id number] Exp: 9/13  Celebrate Vitamins Multivitamin Lot # [id number] Exp: 6/14  Celebrate VitaminsCalcium Citrate Lot # [id number] Exp: 9/13  Jerrilyn [place] Lot# [id number] Exp: 1/14  The following the learning objective met by the patient during this course:   Identifies Pre-Op Dietary Goals and will begin 2 weeks pre-operatively   Identifies appropriate sources of fluids and proteins   States protein recommendations and appropriate sources pre and post-operatively  Identifies Post-Operative Dietary Goals and will follow for 2 weeks post-operatively  Identifies appropriate multivitamin and calcium sources  Describes the need for physical activity post-operatively and will follow MD recommendations  States when to call healthcare provider regarding medication questions or post-operative complications  Handouts given during class include:  Pre-Op Bariatric Surgery Diet Handout  Protein Shake Handout  Post-Op Bariatric Surgery Nutrition Handout  BELT Program Information Flyer  Support Group Information Flyer  Follow-Up Plan: Patient will follow-up at [facility] 2 weeks post operatively for diet advancement per MD.

## 2011-04-04 NOTE — Patient Instructions (Signed)
Follow:    Pre-Op Diet per MD 2 weeks prior to surgery  Phase 2- Liquids (clear/full) 2 weeks after surgery  Vitamin/Mineral/Calcium guidelines for purchasing bariatric supplements  Exercise guidelines pre and post-op per MD  Follow-up at [facility] in 2 weeks post-op for diet advancement. Contact Amiayah Giebel as needed with questions/concerns.  

## 2011-04-05 ENCOUNTER — Ambulatory Visit: Payer: BC Managed Care – PPO

## 2011-04-09 ENCOUNTER — Ambulatory Visit (INDEPENDENT_AMBULATORY_CARE_PROVIDER_SITE_OTHER): Payer: BC Managed Care – PPO | Admitting: Family Medicine

## 2011-04-09 ENCOUNTER — Encounter: Payer: Self-pay | Admitting: Family Medicine

## 2011-04-09 VITALS — BP 142/80 | HR 94 | Temp 98.9°F | Wt 258.2 lb

## 2011-04-09 DIAGNOSIS — J40 Bronchitis, not specified as acute or chronic: Secondary | ICD-10-CM

## 2011-04-09 MED ORDER — GUAIFENESIN-CODEINE 100-10 MG/5ML PO SYRP: 180.0000 mL | ORAL_SOLUTION | Freq: Two times a day (BID) | ORAL | Status: AC | PRN

## 2011-04-09 NOTE — Patient Instructions (Signed)
Sounds like you have a viral upper respiratory infection. Antibiotics are not needed for this.  Viral infections usually take 7-10 days to resolve.  The cough can last several weeks to go away. Use medication as prescribed: cheratussin for cough. Push fluids and plenty of rest. Please return if you are not improving as expected, or if you have high fevers (>101.5) or difficulty swallowing or worsening productive cough. Call clinic with questions.  Good to see you today.  If going on into next week, if worsening cough or shortness of breath ,if fever >101.5, or becoming more sinus symptoms, let me know

## 2011-04-09 NOTE — Progress Notes (Signed)
  Subjective:    Patient ID: Elizabeth Lowery, female    DOB: 11/04/1955, 55 y.o.   MRN: [id number]  HPI CC: cough  1 wk h/o dry cough.  Started as cough.  Lots of sinus drainage, going down into throat.  Some chest congestion.  Worse at night some and in am's.  + fatigue.  No post tussive emesis.  Has tried mucinex, robitussin cough syrup.  No fevers/chills, HA, head congestion, ST, ear pain or tooth pain, abd pain, n/v, rashes, myalgias/arthralgias.  No RN, sneezing.  No sick contacts at home.  Mother smokes at home, outside.  No h/o asthma, COPD.  No h/o allergies.  Upcoming bariatric surgery 04/24/2011.  Review of Systems Per HPI    Objective:   Physical Exam  Nursing note and vitals reviewed. Constitutional: She appears well-developed and well-nourished. No distress.  HENT:  Head: Normocephalic and atraumatic.  Right Ear: Hearing, tympanic membrane, external ear and ear canal normal.  Left Ear: Hearing, tympanic membrane, external ear and ear canal normal.  Nose: No mucosal edema or rhinorrhea. Right sinus exhibits no maxillary sinus tenderness and no frontal sinus tenderness. Left sinus exhibits no maxillary sinus tenderness and no frontal sinus tenderness.  Mouth/Throat: Uvula is midline, oropharynx is clear and moist and mucous membranes are normal. No oropharyngeal exudate, posterior oropharyngeal edema, posterior oropharyngeal erythema or tonsillar abscesses.       Congested TMs bilaterally  Eyes: Conjunctivae and EOM are normal. Pupils are equal, round, and reactive to light. No scleral icterus.  Neck: Normal range of motion. Neck supple.  Cardiovascular: Normal rate, regular rhythm, normal heart sounds and intact distal pulses.   No murmur heard. Pulmonary/Chest: Effort normal and breath sounds normal. No respiratory distress. She has no wheezes. She has no rales.  Musculoskeletal: She exhibits no edema.  Lymphadenopathy:    She has no cervical adenopathy.  Skin: Skin is  warm and dry. No rash noted.      Assessment & Plan:

## 2011-04-09 NOTE — Assessment & Plan Note (Signed)
Bronchitis, early on likely viral.   Treat supportively with cough syrup. Update [place] if worsening, bacterial sxs, for likely azithromycin.

## 2011-04-11 ENCOUNTER — Encounter (HOSPITAL_COMMUNITY): Payer: Self-pay | Admitting: Pharmacy Technician

## 2011-04-11 ENCOUNTER — Ambulatory Visit (INDEPENDENT_AMBULATORY_CARE_PROVIDER_SITE_OTHER): Payer: BC Managed Care – PPO | Admitting: Surgery

## 2011-04-11 ENCOUNTER — Encounter (INDEPENDENT_AMBULATORY_CARE_PROVIDER_SITE_OTHER): Payer: Self-pay | Admitting: Surgery

## 2011-04-11 NOTE — Progress Notes (Signed)
[facility]  Ovidio Kin, MD,  FACS [address] [place]  [address] [place], [place]    [address] Phone:  [phone] FAX:  [phone]   Re:   Elizabeth Lowery DOB:   Feb 13, 1956 MRN:   [id number]  ASSESSMENT AND PLAN: 1. Morbid Obesity.  Pre op Lap Band assessment.   Per the 1991 NIH Consensus Statement, the patient is a candidate for bariatric surgery. The patient attended our information session and reviewed the different types of bariatric surgery.   The patient is scheduled for a  laparoscopic adjustable gastric band. I discussed with the patient the indications and risks of lap band surgery. The potential risks of surgery include, but are not limited to, bleeding, infection, DVT and PE, slippage and erosion of the band, open surgery, and death. The patient understands the importance of compliance and long term follow-up with our group after surgery.   She was given literature regarding lap band surgery.   We will try to do the lap band as an outpatient.  She is given prescriptions for Roxicet and Zofran.  2. History of both hips replaced. Dr. Charlann Boxer.  2. Hypercholesterolemia.  4. Negative GB eval February 2011.  Current Outpatient Prescriptions  Medication Sig Dispense Refill  . Calcium Carb-Cholecalciferol 600-400 MG-UNIT TABS Take 1 tablet by mouth daily.        . cholecalciferol (VITAMIN D) 1000 UNITS tablet Take 1,000 Units by mouth daily.        . fish oil-omega-3 fatty acids 1000 MG capsule Take 2 g by mouth daily.        . fluticasone (FLONASE) 50 MCG/ACT nasal spray Place 1 spray into the nose daily.        Marland Kitchen guaiFENesin-codeine (ROBITUSSIN AC) 100-10 MG/5ML syrup Take 180 mLs by mouth 2 (two) times daily as needed for cough.  180 mL  0  . Magnesium Gluconate 550 MG TABS Take 1 tablet by mouth daily.        . Multiple Vitamin (MULTIVITAMIN) tablet Take 1 tablet by mouth daily.        . simvastatin (ZOCOR) 40 MG tablet TAKE 1 TABLET BY MOUTH ONCE A DAY  30  tablet  9  . vitamin C (ASCORBIC ACID) 500 MG tablet Take 500 mg by mouth daily.            HISTORY OF PRESENT ILLNESS: Chief Complaint  Patient presents with  . Bariatric Pre-op    Pre op lap band 04/24/11    Elizabeth Lowery is a 55 y.o. (DOB: 1956-05-10)  white female who is a patient of Kerby Nora, MD, MD and comes to me today for pre op lap band. The patient is excited about going ahead with the LAP-BAND. I spent 15 minutes going over the surgery, I demonstrated the location of the port, I showed her the model of the LAP-BAND, and we talked about exercise as component of weight loss.  I tried to answer her questions.  I reviewed the following tests with her:  Her labs done 02/07/2011 were okay. Abd US done 02/05/2011 showed hepatic steatosis, no gall stones. UGI on 02/05/2011 was normal. Breath Tek 02/09/2011 was neg. I have a letter from Dr. Vernona Rieger dated 03/07/2011 which approves her for bariatric surgery. She has seen Amy Bridges.  PHYSICAL EXAM: BP 146/90  Pulse 76  Temp(Src) 97.6 F (36.4 C) (Temporal)  Resp 16  Ht 5\' 4"  (1.626 m)  Wt 257 lb (116.574 kg)  BMI 44.11  kg/m2  General: Obese WF who is alert and generally healthy appearing.  HEENT: Normal. Pupils equal. Good dentition. Neck: Supple. No mass.  No thyroid mass.  Carotid pulse okay with no bruit. Lymph Nodes:  No supraclavicular or cervical nodes. Lungs: Clear to auscultation and symmetric breath sounds. Heart:  RRR. No murmur or rub. Abdomen: Soft. No mass. No tenderness. No hernia. Normal bowel sounds.  No abdominal scars.  She is more apple in shape than pear. Rectal: Not done. Extremities:  Good strength and ROM  in upper and lower extremities. Neurologic:  Grossly intact to motor and sensory function. Psychiatric: Has normal mood and affect. Behavior is normal.    DATA REVIEWED: Reviewed in HPI.   Ovidio Kin, MD, FACS Office:  [phone]

## 2011-04-11 NOTE — Patient Instructions (Signed)
1.  You have literature on Lap Band.  2.  You have prescription for Roxicet and Zofan.  3.  Continue on pre op diet.

## 2011-04-18 ENCOUNTER — Encounter (HOSPITAL_COMMUNITY): Payer: Self-pay

## 2011-04-18 ENCOUNTER — Encounter (HOSPITAL_COMMUNITY)
Admission: RE | Admit: 2011-04-18 | Discharge: 2011-04-18 | Disposition: A | Discharge status: Home / Self Care | Payer: BC Managed Care – PPO | Source: Ambulatory Visit | Attending: Surgery | Admitting: Surgery

## 2011-04-18 HISTORY — DX: Nausea with vomiting, unspecified: Z98.890

## 2011-04-18 HISTORY — DX: Nausea with vomiting, unspecified: R11.2

## 2011-04-18 HISTORY — DX: Other seasonal allergic rhinitis: J30.2

## 2011-04-18 LAB — CBC
HCT: 43.5 % (ref 36.0–46.0)
Hemoglobin: 14.8 g/dL (ref 12.0–15.0)
RDW: 13.3 % (ref 11.5–15.5)
WBC: 7.3 10*3/uL (ref 4.0–10.5)

## 2011-04-18 LAB — COMPREHENSIVE METABOLIC PANEL
ALT: 37 U/L — ABNORMAL HIGH (ref 0–35)
Albumin: 4.2 g/dL (ref 3.5–5.2)
Alkaline Phosphatase: 70 U/L (ref 39–117)
BUN: 19 mg/dL (ref 6–23)
Chloride: 103 mEq/L (ref 96–112)
Glucose, Bld: 83 mg/dL (ref 70–99)
Potassium: 3.9 mEq/L (ref 3.5–5.1)
Sodium: 139 mEq/L (ref 135–145)
Total Bilirubin: 0.3 mg/dL (ref 0.3–1.2)

## 2011-04-18 LAB — DIFFERENTIAL
Basophils Relative: 0 % (ref 0–1)
Eosinophils Absolute: 0.1 10*3/uL (ref 0.0–0.7)
Eosinophils Relative: 2 % (ref 0–5)
Lymphs Abs: 2.1 10*3/uL (ref 0.7–4.0)
Monocytes Relative: 6 % (ref 3–12)
Neutrophils Relative %: 62 % (ref 43–77)

## 2011-04-18 LAB — SURGICAL PCR SCREEN: Staphylococcus aureus: NEGATIVE

## 2011-04-18 NOTE — Pre-Procedure Instructions (Signed)
Chest  Xray not done as negative history

## 2011-04-18 NOTE — Pre-Procedure Instructions (Signed)
EKG 9/12 in EPIC

## 2011-04-18 NOTE — Patient Instructions (Addendum)
20 Elizabeth Lowery  04/18/2011  Your procedure is scheduled on:04/24/11   Tuesday  [id number]  Report to [facility] at 0515 AM.  Call this number if you have problems the morning of surgery: [phone]   Remember:             No aspirin, antiinflammatory drugs, herbal meds for 7 days prior to surgery  Do not eat food:After Midnight.  Monday night  May have clear liquids:until Midnight . Monday night  Clear liquids include soda, tea, black coffee, apple or grape juice, broth.  Take these medicines the morning of surgery with A SIP OF WATER: none   Do not wear jewelry, make-up or nail polish.  Do not wear lotions, powders, or perfumes. You may wear deodorant.  Do not shave 48 hours prior to surgery.  Do not bring valuables to the hospital.  Contacts, dentures or bridgework may not be worn into surgery.  Leave suitcase in the car. After surgery it may be brought to your room.  For patients admitted to the hospital, checkout time is 11:00 AM the day of discharge.   Patients discharged the day of surgery will not be allowed to drive home.  Name and phone number of your driver: sister  Special Instructions: CHG Shower Use Special Wash: 1/2 bottle night before surgery and 1/2 bottle morning of surgery.  Regular soap face and privates  No shaving x 48 hrs instructions   Please read over the following fact sheets that you were given: MRSA Information

## 2011-04-24 ENCOUNTER — Encounter (HOSPITAL_COMMUNITY): Payer: Self-pay | Admitting: *Deleted

## 2011-04-24 ENCOUNTER — Ambulatory Visit (HOSPITAL_COMMUNITY)
Admission: RE | Admit: 2011-04-24 | Discharge: 2011-04-24 | Disposition: A | Discharge status: Home / Self Care | Payer: BC Managed Care – PPO | Source: Ambulatory Visit | Attending: Surgery | Admitting: Surgery

## 2011-04-24 ENCOUNTER — Ambulatory Visit (HOSPITAL_COMMUNITY): Payer: BC Managed Care – PPO | Admitting: *Deleted

## 2011-04-24 ENCOUNTER — Encounter (HOSPITAL_COMMUNITY)
Admission: RE | Disposition: A | Discharge status: Home / Self Care | Payer: Self-pay | Source: Ambulatory Visit | Attending: Surgery

## 2011-04-24 ENCOUNTER — Encounter (HOSPITAL_COMMUNITY): Payer: Self-pay | Admitting: Surgery

## 2011-04-24 ENCOUNTER — Ambulatory Visit (HOSPITAL_COMMUNITY): Payer: BC Managed Care – PPO

## 2011-04-24 DIAGNOSIS — Z96649 Presence of unspecified artificial hip joint: Secondary | ICD-10-CM | POA: Insufficient documentation

## 2011-04-24 DIAGNOSIS — E7800 Pure hypercholesterolemia, unspecified: Secondary | ICD-10-CM | POA: Insufficient documentation

## 2011-04-24 DIAGNOSIS — Z01812 Encounter for preprocedural laboratory examination: Secondary | ICD-10-CM | POA: Insufficient documentation

## 2011-04-24 DIAGNOSIS — Z79899 Other long term (current) drug therapy: Secondary | ICD-10-CM | POA: Insufficient documentation

## 2011-04-24 DIAGNOSIS — Z6841 Body Mass Index (BMI) 40.0 and over, adult: Secondary | ICD-10-CM | POA: Insufficient documentation

## 2011-04-24 HISTORY — PX: LAPAROSCOPIC GASTRIC BANDING: SHX1100

## 2011-04-24 SURGERY — GASTRIC BANDING, LAPAROSCOPIC
Anesthesia: General | Wound class: Clean

## 2011-04-24 MED ORDER — HYDROMORPHONE HCL PF 1 MG/ML IJ SOLN: 0.2500 mg | INTRAMUSCULAR | Status: DC | PRN

## 2011-04-24 MED ORDER — ONDANSETRON HCL 4 MG/2ML IJ SOLN
4.0000 mg | Freq: Once | INTRAMUSCULAR | Status: AC
  Administered 2011-04-24: 4 mg via INTRAVENOUS

## 2011-04-24 MED ORDER — LIDOCAINE HCL (CARDIAC) 20 MG/ML IV SOLN
INTRAVENOUS | Status: DC | PRN
  Administered 2011-04-24: 50 mg via INTRAVENOUS

## 2011-04-24 MED ORDER — ONDANSETRON HCL 4 MG/2ML IJ SOLN
INTRAMUSCULAR | Status: AC
  Administered 2011-04-24: 4 mg via INTRAVENOUS
  Filled 2011-04-24: qty 2

## 2011-04-24 MED ORDER — PROPOFOL 10 MG/ML IV BOLUS
INTRAVENOUS | Status: DC | PRN
  Administered 2011-04-24: 200 mg via INTRAVENOUS

## 2011-04-24 MED ORDER — MIDAZOLAM HCL 5 MG/5ML IJ SOLN
INTRAMUSCULAR | Status: DC | PRN
  Administered 2011-04-24: 2 mg via INTRAVENOUS

## 2011-04-24 MED ORDER — METOCLOPRAMIDE HCL 5 MG/ML IJ SOLN
INTRAMUSCULAR | Status: DC | PRN
  Administered 2011-04-24: 10 mg via INTRAVENOUS

## 2011-04-24 MED ORDER — ROCURONIUM BROMIDE 100 MG/10ML IV SOLN
INTRAVENOUS | Status: DC | PRN
  Administered 2011-04-24: 15 mg via INTRAVENOUS
  Administered 2011-04-24: 50 mg via INTRAVENOUS

## 2011-04-24 MED ORDER — BUPIVACAINE HCL (PF) 0.25 % IJ SOLN
INTRAMUSCULAR | Status: DC | PRN
  Administered 2011-04-24: 9 mL

## 2011-04-24 MED ORDER — BUPIVACAINE LIPOSOME 1.3 % IJ SUSP
INTRAMUSCULAR | Status: DC | PRN
  Administered 2011-04-24: 20 mL

## 2011-04-24 MED ORDER — BUPIVACAINE HCL (PF) 0.25 % IJ SOLN
INTRAMUSCULAR | Status: AC
  Filled 2011-04-24: qty 30

## 2011-04-24 MED ORDER — CEFAZOLIN SODIUM-DEXTROSE 2-3 GM-% IV SOLR
2.0000 g | INTRAVENOUS | Status: AC
  Administered 2011-04-24: 2 g via INTRAVENOUS

## 2011-04-24 MED ORDER — CEFAZOLIN SODIUM 1-5 GM-% IV SOLN
INTRAVENOUS | Status: AC
  Filled 2011-04-24: qty 100

## 2011-04-24 MED ORDER — PROMETHAZINE HCL 25 MG/ML IJ SOLN: 6.2500 mg | INTRAMUSCULAR | Status: DC | PRN

## 2011-04-24 MED ORDER — GLYCOPYRROLATE 0.2 MG/ML IJ SOLN
INTRAMUSCULAR | Status: DC | PRN
  Administered 2011-04-24: 0.2 mg via INTRAVENOUS

## 2011-04-24 MED ORDER — HEPARIN SODIUM (PORCINE) 5000 UNIT/ML IJ SOLN
5000.0000 [IU] | Freq: Once | INTRAMUSCULAR | Status: AC
  Administered 2011-04-24: 5000 [IU] via SUBCUTANEOUS

## 2011-04-24 MED ORDER — NEOSTIGMINE METHYLSULFATE 1 MG/ML IJ SOLN
INTRAMUSCULAR | Status: DC | PRN
  Administered 2011-04-24: 2 mg via INTRAVENOUS

## 2011-04-24 MED ORDER — DEXAMETHASONE SODIUM PHOSPHATE 10 MG/ML IJ SOLN
INTRAMUSCULAR | Status: DC | PRN
  Administered 2011-04-24: 10 mg via INTRAVENOUS

## 2011-04-24 MED ORDER — SODIUM CHLORIDE 0.9 % IJ SOLN
INTRAMUSCULAR | Status: DC | PRN
  Administered 2011-04-24: 10 mL via INTRAVENOUS

## 2011-04-24 MED ORDER — BUPIVACAINE LIPOSOME 1.3 % IJ SUSP
20.0000 mL | Freq: Once | INTRAMUSCULAR | Status: DC
  Filled 2011-04-24: qty 20

## 2011-04-24 MED ORDER — SODIUM CHLORIDE 0.9 % IR SOLN
Status: DC | PRN
  Administered 2011-04-24: 1000 mL

## 2011-04-24 MED ORDER — SCOPOLAMINE 1 MG/3DAYS TD PT72: 1.0000 | MEDICATED_PATCH | TRANSDERMAL | Status: DC

## 2011-04-24 MED ORDER — FENTANYL CITRATE 0.05 MG/ML IJ SOLN
INTRAMUSCULAR | Status: DC | PRN
  Administered 2011-04-24 (×2): 100 ug via INTRAVENOUS
  Administered 2011-04-24 (×2): 50 ug via INTRAVENOUS
  Administered 2011-04-24: 100 ug via INTRAVENOUS
  Administered 2011-04-24 (×2): 50 ug via INTRAVENOUS

## 2011-04-24 MED ORDER — SODIUM CHLORIDE 0.9 % IJ SOLN
INTRAMUSCULAR | Status: DC | PRN
  Administered 2011-04-24: 20 mL

## 2011-04-24 MED ORDER — ONDANSETRON HCL 4 MG/2ML IJ SOLN
INTRAMUSCULAR | Status: DC | PRN
  Administered 2011-04-24: 4 mg via INTRAVENOUS

## 2011-04-24 MED ORDER — LACTATED RINGERS IV SOLN
INTRAVENOUS | Status: DC | PRN
  Administered 2011-04-24: 07:00:00 via INTRAVENOUS

## 2011-04-24 SURGICAL SUPPLY — 60 items
BAND LAP VG SYSTEM W/TUBES (Band) ×1 IMPLANT
BENZOIN TINCTURE PRP APPL 2/3 (GAUZE/BANDAGES/DRESSINGS) IMPLANT
BLADE HEX COATED 2.75 (ELECTRODE) ×2 IMPLANT
BLADE SURG 15 STRL LF DISP TIS (BLADE) ×1 IMPLANT
BLADE SURG 15 STRL SS (BLADE) ×1
CANISTER SUCTION 2500CC (MISCELLANEOUS) ×2 IMPLANT
CHLORAPREP W/TINT 26ML (MISCELLANEOUS) ×2 IMPLANT
CLOTH BEACON ORANGE TIMEOUT ST (SAFETY) ×2 IMPLANT
COVER SURGICAL LIGHT HANDLE (MISCELLANEOUS) ×2 IMPLANT
DECANTER SPIKE VIAL GLASS SM (MISCELLANEOUS) ×2 IMPLANT
DERMABOND ADVANCED (GAUZE/BANDAGES/DRESSINGS) ×1
DERMABOND ADVANCED .7 DNX12 (GAUZE/BANDAGES/DRESSINGS) ×1 IMPLANT
DEVICE SUT QUICK LOAD TK 5 (STAPLE) ×6 IMPLANT
DEVICE SUT TI-KNOT TK 5X26 (MISCELLANEOUS) ×2 IMPLANT
DEVICE SUTURE ENDOST 10MM (ENDOMECHANICALS) IMPLANT
DISSECTOR BLUNT TIP ENDO 5MM (MISCELLANEOUS) IMPLANT
DRAPE CAMERA CLOSED 9X96 (DRAPES) ×2 IMPLANT
ELECT REM PT RETURN 9FT ADLT (ELECTROSURGICAL) ×2
ELECTRODE REM PT RTRN 9FT ADLT (ELECTROSURGICAL) ×1 IMPLANT
GLOVE BIOGEL PI IND STRL 7.0 (GLOVE) ×1 IMPLANT
GLOVE BIOGEL PI INDICATOR 7.0 (GLOVE) ×1
GLOVE SURG SIGNA 7.5 PF LTX (GLOVE) ×2 IMPLANT
GOWN STRL NON-REIN LRG LVL3 (GOWN DISPOSABLE) ×2 IMPLANT
GOWN STRL REIN XL XLG (GOWN DISPOSABLE) ×4 IMPLANT
HOVERMATT SINGLE USE (MISCELLANEOUS) ×2 IMPLANT
KIT BASIN OR (CUSTOM PROCEDURE TRAY) ×2 IMPLANT
LAP BAND VG SYSTEM W/TUBES (Band) ×2 IMPLANT
MESH HERNIA 1X4 RECT BARD (Mesh General) ×1 IMPLANT
MESH HERNIA BARD 1X4 (Mesh General) ×1 IMPLANT
NEEDLE SPNL 22GX3.5 QUINCKE BK (NEEDLE) ×2 IMPLANT
NS IRRIG 1000ML POUR BTL (IV SOLUTION) ×2 IMPLANT
PACK UNIVERSAL I (CUSTOM PROCEDURE TRAY) ×2 IMPLANT
PENCIL BUTTON HOLSTER BLD 10FT (ELECTRODE) ×2 IMPLANT
SCALPEL HARMONIC ACE (MISCELLANEOUS) ×2 IMPLANT
SET IRRIG TUBING LAPAROSCOPIC (IRRIGATION / IRRIGATOR) ×2 IMPLANT
SLEEVE Z-THREAD 5X100MM (TROCAR) IMPLANT
SOLUTION ANTI FOG 6CC (MISCELLANEOUS) ×2 IMPLANT
SPONGE LAP 18X18 X RAY DECT (DISPOSABLE) ×2 IMPLANT
STAPLER VISISTAT 35W (STAPLE) ×2 IMPLANT
STRIP CLOSURE SKIN 1/2X4 (GAUZE/BANDAGES/DRESSINGS) IMPLANT
SUT ETHIBOND 2 0 SH (SUTURE) ×3
SUT ETHIBOND 2 0 SH 36X2 (SUTURE) ×3 IMPLANT
SUT PROLENE 2 0 CT2 30 (SUTURE) ×2 IMPLANT
SUT SILK 0 (SUTURE) ×1
SUT SILK 0 30XBRD TIE 6 (SUTURE) ×1 IMPLANT
SUT SURGIDAC NAB ES-9 0 48 120 (SUTURE) IMPLANT
SUT VIC AB 2-0 SH 27 (SUTURE) ×1
SUT VIC AB 2-0 SH 27X BRD (SUTURE) ×1 IMPLANT
SUT VIC AB 5-0 PS2 18 (SUTURE) ×4 IMPLANT
SYR 20CC LL (SYRINGE) ×2 IMPLANT
SYR CONTROL 10ML LL (SYRINGE) ×2 IMPLANT
SYS KII OPTICAL ACCESS 15MM (TROCAR) ×2
SYSTEM KII OPTICAL ACCESS 15MM (TROCAR) ×1 IMPLANT
TOWEL OR 17X26 10 PK STRL BLUE (TOWEL DISPOSABLE) ×2 IMPLANT
TROCAR XCEL NON-BLD 11X100MML (ENDOMECHANICALS) ×2 IMPLANT
TROCAR Z-THREAD FIOS 11X100 BL (TROCAR) ×2 IMPLANT
TROCAR Z-THREAD FIOS 5X100MM (TROCAR) ×2 IMPLANT
TROCAR Z-THREAD SLEEVE 11X100 (TROCAR) ×2 IMPLANT
TUBE CALIBRATION LAPBAND (TUBING) ×2 IMPLANT
TUBING INSUFFLATION 10FT LAP (TUBING) ×2 IMPLANT

## 2011-04-24 NOTE — Progress Notes (Signed)
1330 Nausea better gone since getting Zofran, ambulated in the hallway to the bathroom  Without problems

## 2011-04-24 NOTE — Discharge Instructions (Signed)
1.  Water for 24 hours.   If tolerated, go to clear liquids with protein supplement.  You should have an appointment in about 2 to 3 weeks with the dietitian.  She will advance your diet.  2.  You should have an appt with Dr. Ezzard Standing in 2 to 3 weeks.  3.  Leave incisions dry for 48 hours, then you may shower.  4.  You already have pain meds and nausea meds.  5.  No driving for 3 to 4 days.  If you are doing well at that time, then you may drive.

## 2011-04-24 NOTE — Anesthesia Preprocedure Evaluation (Signed)
Anesthesia Evaluation  Patient identified by MRN, date of birth, ID band Patient awake    Reviewed: Allergy & Precautions, H&P , NPO status , Patient's Chart, lab work & pertinent test results  History of Anesthesia Complications (+) PONV and Family history of anesthesia reaction  Airway Mallampati: II TM Distance: >3 FB Neck ROM: Full    Dental No notable dental hx.    Pulmonary neg pulmonary ROS,  clear to auscultation  Pulmonary exam normal       Cardiovascular neg cardio ROS Regular Normal    Neuro/Psych Negative Neurological ROS  Negative Psych ROS   GI/Hepatic negative GI ROS, Neg liver ROS, Fatty liver   Endo/Other  Negative Endocrine ROS  Renal/GU negative Renal ROS  Genitourinary negative   Musculoskeletal negative musculoskeletal ROS (+)   Abdominal (+) obese,   Peds negative pediatric ROS (+)  Hematology negative hematology ROS (+)   Anesthesia Other Findings   Reproductive/Obstetrics negative OB ROS                           Anesthesia Physical Anesthesia Plan  ASA: II  Anesthesia Plan: General   Post-op Pain Management:    Induction: Intravenous  Airway Management Planned: Oral ETT  Additional Equipment:   Intra-op Plan:   Post-operative Plan: Extubation in OR  Informed Consent: I have reviewed the patients History and Physical, chart, labs and discussed the procedure including the risks, benefits and alternatives for the proposed anesthesia with the patient or authorized representative who has indicated his/her understanding and acceptance.   Dental advisory given  Plan Discussed with: CRNA  Anesthesia Plan Comments:         Anesthesia Quick Evaluation

## 2011-04-24 NOTE — Brief Op Note (Signed)
04/24/2011  9:01 AM  PATIENT:  Elizabeth Lowery, 55 y.o., female, MRN: [id number]  PREOP DIAGNOSIS:  morbid obesity   POSTOP DIAGNOSIS:   Morbid obesity, BMI 44.1, Weight - 257  PROCEDURE:   Procedure(s): LAPAROSCOPIC GASTRIC BANDING, AP large  SURGEON:   Ovidio Kin, M.D.  ASSISTANTMarland Kitchen   Fredonia Highland, MD  ANESTHESIA:   general  Azell Der, MD - Anesthesiologist  General  EBL:  min  ml  BLOOD ADMINISTERED: none  DRAINS: none   LOCAL MEDICATIONS USED:   20 cc experel  SPECIMEN:   none  COUNTS CORRECT:  YES  INDICATIONS FOR PROCEDURE:  Elizabeth Lowery is a 55 y.o. (DOB: May 24, 1955) white female whose primary care physician is Kerby Nora, MD, MD and comes for lap band for morbid obesity.   The indications and risks of the surgery were explained to the patient.  The risks include, but are not limited to, infection, bleeding, and nerve injury.  Note dictated to:   #[id number]  DN  04/24/2011

## 2011-04-24 NOTE — Op Note (Signed)
NAMESUSI, GOSLIN                ACCOUNT NO.:  [id number]  MEDICAL RECORD NO.:  [id number]  LOCATION:  [facility]                         FACILITY:  [facility]  PHYSICIAN:  Sandria Bales. Ezzard Standing, M.D.  DATE OF BIRTH:  31-May-1955  DATE OF PROCEDURE:  04/24/2011                              OPERATIVE REPORT   PREOPERATIVE DIAGNOSIS:  Morbid obesity, weight 257, BMI 44.1.  POSTOPERATIVE DIAGNOSIS:  Morbid obesity, weight 257, BMI 44.1.  PROCEDURE:  Placement of laparoscopic band, AP large.  SURGEON:  Sandria Bales. Ezzard Standing, M.D.  FIRST ASSISTANT:  Mary Sella. Andrey Campanile, MD  ANESTHESIA:  General endotracheal, supervised by Dr. Sherrian Divers.  ESTIMATED BLOOD LOSS:  Minimal.  INDICATION FOR PROCEDURE:  Ms. Hellenbrand is a 55 year old white female, patient of Dr. Kerby Nora, who has been through our preoperative bariatric program.  She has been having a Lap-Band placed.  I discussed with her the indications, potential complications of Lap- Band placement.  Potential complications include, but not limited to, bleeding, infection, bowel injury, slippage or erosion of the band, and long-term nutritional consequences.  OPERATIVE NOTE:  The patient was placed in the supine position in room #2 at [facility], supervised by Dr. Sherrian Divers.  A time-out was held and a surgical checklist run.  She was given 2 g of Ancef to initiate procedure.  Her abdomen was prepped with ChloraPrep and sterilely draped.  I accessed the abdominal cavity through the left upper quadrant using a 10 and 11 mm Ethicon Optiview trocar inserted into the abdominal cavity. I went through the omentum with the trocar, but had no obvious bowel injury.  I then placed 4 additional trocars.  I placed a 5 mm in the subxiphoid location for the liver retractor, a 15 mm in the right subcostal location for placement of the trocar, the band, a 10-11 trocar to the right paramedian, a 10-11 trocar to the left paramedian for the camera and then  a 5 mm lateral subcostal trocar.  Right and left lobes of liver were unremarkable.  Stomach was unremarkable.  The bowel that I could see was unremarkable, but she had a lot of central obesity.  She had a generous fat pad near the GE junction and the proximal stomach.  First, I dissected along the angle of Hiss, made a small opening below the left crura of the diaphragm.  I then went through the gastrohepatic ligament, identified the right crura.  I went down to where there was crossing fat over the right crus and passed the finger dissector posteriorly around this.  Because the amount of fat she had in her upper abdomen and because of her central obesity, I put an APL band in place.  The APL band was placed around the proximal stomach, and cinched down without difficulty.  I put 3 imbricating sutures of 0 Ethibond using tie knots to hold these in place.  A photo was taken and placed in the chart.  The band lay in good position, was not too tight, but did incorporate a lot of proximal fat.  I then brought out the tubing through the right paramedian incision.  The liver retractor was then removed.  The silastic tubing of the band was attached to the reservoir device.  The reservoir device had mesh backing and was placed in a pocket lateral to the mid subcostal incision.  Each trocar site was infiltrated with Exparel, and mixed 10 mL of saline with Exparel 20 cc of Exparel which is a 30 cc volume including the saline.  I then closed the subcutaneous tissue over the port with 3-0 Vicryl suture.  I closed the skin each site with a 5-0 Vicryl suture, painted the wounds with Dermabond, sterilely dressed.    The patient was transported to recovery room in good condition.  Sponge and needle count were correct at the end of the case.  She hopes to go home today.   Sandria Bales. Ezzard Standing, M.D., FACS   DHN/MEDQ  D:  04/24/2011  T:  04/24/2011  Job:  [id number]  cc:   Kerby Nora, MD Fax:  [phone]  Vilinda Flake, Ph.D. Fax: [phone]

## 2011-04-24 NOTE — Anesthesia Postprocedure Evaluation (Signed)
  Anesthesia Post-op Note  Patient: Elizabeth Lowery  Procedure(s) Performed:  LAPAROSCOPIC GASTRIC BANDING - with Mesh under port  Patient Location: PACU  Anesthesia Type: General  Level of Consciousness: awake and alert   Airway and Oxygen Therapy: Patient Spontanous Breathing  Post-op Pain: mild  Post-op Assessment: Post-op Vital signs reviewed, Patient's Cardiovascular Status Stable, Respiratory Function Stable, Patent Airway and No signs of Nausea or vomiting  Post-op Vital Signs: stable  Complications: No apparent anesthesia complications

## 2011-04-24 NOTE — Interval H&P Note (Signed)
History and Physical Interval Note:  04/24/2011 7:17 AM  Elizabeth Lowery  has presented today for surgery, with the diagnosis of morbid obesity   The various methods of treatment have been discussed with the patient and family. After consideration of risks, benefits and other options for treatment, the patient has consented to  Procedure(s): LAPAROSCOPIC GASTRIC BANDING as a surgical intervention .  The patients' history has been reviewed, patient examined, no change in status, stable for surgery.  I have reviewed the patients' chart and labs.  Questions were answered to the patient's satisfaction.     [name]

## 2011-04-24 NOTE — Transfer of Care (Signed)
Immediate Anesthesia Transfer of Care Note  Patient: Elizabeth Lowery  Procedure(s) Performed:  LAPAROSCOPIC GASTRIC BANDING - with Mesh under port  Patient Location: PACU  Anesthesia Type: General  Level of Consciousness: awake, alert  and oriented  Airway & Oxygen Therapy: Patient Spontanous Breathing and Patient connected to face mask oxygen  Post-op Assessment: Report given to PACU RN, Post -op Vital signs reviewed and stable and Patient moving all extremities  Post vital signs: Reviewed and stable  Complications: No apparent anesthesia complications

## 2011-04-24 NOTE — H&P (View-Only) (Signed)
[facility]  Coletta Lockner, MD,  FACS [address]  [address] [place], [place]    [address] Phone:  [phone] FAX:  [phone]   Re:   Elizabeth Lowery DOB:   08/15/1955 MRN:   [id number]  ASSESSMENT AND PLAN: 1. Morbid Obesity.  Pre op Lap Band assessment.   Per the 1991 NIH Consensus Statement, the patient is a candidate for bariatric surgery. The patient attended our information session and reviewed the different types of bariatric surgery.   The patient is scheduled for a  laparoscopic adjustable gastric band. I discussed with the patient the indications and risks of lap band surgery. The potential risks of surgery include, but are not limited to, bleeding, infection, DVT and PE, slippage and erosion of the band, open surgery, and death. The patient understands the importance of compliance and long term follow-up with our group after surgery.   She was given literature regarding lap band surgery.   We will try to do the lap band as an outpatient.  She is given prescriptions for Roxicet and Zofran.  2. History of both hips replaced. Dr. Olin.  2. Hypercholesterolemia.  4. Negative GB eval February 2011.  Current Outpatient Prescriptions  Medication Sig Dispense Refill  . Calcium Carb-Cholecalciferol 600-400 MG-UNIT TABS Take 1 tablet by mouth daily.        . cholecalciferol (VITAMIN D) 1000 UNITS tablet Take 1,000 Units by mouth daily.        . fish oil-omega-3 fatty acids 1000 MG capsule Take 2 g by mouth daily.        . fluticasone (FLONASE) 50 MCG/ACT nasal spray Place 1 spray into the nose daily.        . guaiFENesin-codeine (ROBITUSSIN AC) 100-10 MG/5ML syrup Take 180 mLs by mouth 2 (two) times daily as needed for cough.  180 mL  0  . Magnesium Gluconate 550 MG TABS Take 1 tablet by mouth daily.        . Multiple Vitamin (MULTIVITAMIN) tablet Take 1 tablet by mouth daily.        . simvastatin (ZOCOR) 40 MG tablet TAKE 1 TABLET BY MOUTH ONCE A DAY  30  tablet  9  . vitamin C (ASCORBIC ACID) 500 MG tablet Take 500 mg by mouth daily.            HISTORY OF PRESENT ILLNESS: Chief Complaint  Patient presents with  . Bariatric Pre-op    Pre op lap band 04/24/11    Elizabeth Lowery is a 55 y.o. (DOB: 08/23/1955)  white female who is a patient of Amy Bedsole, MD, MD and comes to me today for pre op lap band. The patient is excited about going ahead with the LAP-BAND. I spent 15 minutes going over the surgery, I demonstrated the location of the port, I showed her the model of the LAP-BAND, and we talked about exercise as component of weight loss.  I tried to answer her questions.  I reviewed the following tests with her:  Her labs done 02/07/2011 were okay. Abd US done 02/05/2011 showed hepatic steatosis, no gall stones. UGI on 02/05/2011 was normal. Breath Tek 02/09/2011 was neg. I have a letter from Dr. E. Lurey dated 03/07/2011 which approves her for bariatric surgery. She has seen Amy Bridges.  PHYSICAL EXAM: BP 146/90  Pulse 76  Temp(Src) 97.6 F (36.4 C) (Temporal)  Resp 16  Ht 5' 4" (1.626 m)  Wt 257 lb (116.574 kg)  BMI 44.11   kg/m2  General: Obese WF who is alert and generally healthy appearing.  HEENT: Normal. Pupils equal. Good dentition. Neck: Supple. No mass.  No thyroid mass.  Carotid pulse okay with no bruit. Lymph Nodes:  No supraclavicular or cervical nodes. Lungs: Clear to auscultation and symmetric breath sounds. Heart:  RRR. No murmur or rub. Abdomen: Soft. No mass. No tenderness. No hernia. Normal bowel sounds.  No abdominal scars.  She is more apple in shape than pear. Rectal: Not done. Extremities:  Good strength and ROM  in upper and lower extremities. Neurologic:  Grossly intact to motor and sensory function. Psychiatric: Has normal mood and affect. Behavior is normal.    DATA REVIEWED: Reviewed in HPI.   Alveena Taira, MD, FACS Office:  [phone]  

## 2011-04-25 NOTE — Progress Notes (Signed)
Called to check on post op Lap band pt; pt alert and oriented; states she is doing well;denies nausea or vomiting; tol water well; states she will start protein shakes today; ambulating without difficulty; voiding without difficulty; verbalized pain relieved with po pain meds; asked if she had any questions/concerns and pt denies any at this time; encouraged pt to call if questions/concerns arise and pt stated she would. Talmadge Chad, RN Bariatric Nurse Coordinator

## 2011-04-26 ENCOUNTER — Encounter (HOSPITAL_COMMUNITY): Payer: Self-pay | Admitting: Surgery

## 2011-05-08 ENCOUNTER — Encounter: Payer: BC Managed Care – PPO | Attending: Surgery

## 2011-05-08 DIAGNOSIS — Z09 Encounter for follow-up examination after completed treatment for conditions other than malignant neoplasm: Secondary | ICD-10-CM | POA: Insufficient documentation

## 2011-05-08 DIAGNOSIS — Z9884 Bariatric surgery status: Secondary | ICD-10-CM | POA: Insufficient documentation

## 2011-05-08 DIAGNOSIS — Z713 Dietary counseling and surveillance: Secondary | ICD-10-CM | POA: Insufficient documentation

## 2011-05-08 NOTE — Patient Instructions (Signed)
Patient to follow Phase 3A-Soft, High Protein Diet and follow-up at [facility] in 6 weeks for 2 months post-op nutrition visit for diet advancement. 

## 2011-05-08 NOTE — Progress Notes (Signed)
  Bariatric Class:  Appt start time: 1600 end time:  1700.  2 Week Post-Operative Nutrition Class  Patient was seen on 05/08/2011 for Post-[facility]ative Nutrition [facility].   Surgery date: 04/24/11  Surgery type: LAGB   Weight today: 236.7 lb Weight change: 21.7 lbs Total weight lost: 21.7 lbs total BMI: 40.7  The following the learning objective met the patient during this course:   Identifies Phase 3A (Soft, High Proteins) Dietary Goals and will begin from 2 weeks post-operatively to 2 months post-operatively   Identifies appropriate sources of fluids and proteins   States protein recommendations and appropriate sources post-operatively  Identifies the need for appropriate texture modifications, mastication, and bite sizes when consuming solids  Identifies appropriate multivitamin and calcium sources post-operatively  Describes the need for physical activity post-operatively and will follow MD recommendations  States when to call healthcare provider regarding medication questions or post-operative complications  Handouts given during class include:  Phase 3A: Soft, High Protein Diet Handout  Band Fill Guidelines Handout  Follow-Up Plan: Patient will follow-up at [facility] in 6 weeks for 2 months post-op nutrition visit for diet advancement per MD.

## 2011-05-10 ENCOUNTER — Ambulatory Visit (INDEPENDENT_AMBULATORY_CARE_PROVIDER_SITE_OTHER): Payer: BC Managed Care – PPO | Admitting: Surgery

## 2011-05-10 ENCOUNTER — Encounter (INDEPENDENT_AMBULATORY_CARE_PROVIDER_SITE_OTHER): Payer: Self-pay | Admitting: Surgery

## 2011-05-10 DIAGNOSIS — Z9884 Bariatric surgery status: Secondary | ICD-10-CM | POA: Insufficient documentation

## 2011-05-10 NOTE — Progress Notes (Signed)
[facility]  Ovidio Kin, MD,  FACS [address] [place]  [address] [place], [place]    [address] Phone:  [phone] FAX:  [phone]   Re:   Elizabeth Lowery DOB:   [birth year] MRN:   [id number]  ASSESSMENT AND PLAN:  1. Morbid Obesity.    Lap Band 04/24/2011.   Looks good, has advanced diet.   I'll see her back in 6 to 8 weeks for possible first lap band fill.   2. History of both hips replaced. Dr. Charlann Boxer.   2. Hypercholesterolemia.   4. Negative GB eval February 2011.   HISTORY OF PRESENT ILLNESS: Chief Complaint  Patient presents with  . Lap Band Fill    1st po lap band sx 12/4     Elizabeth Lowery is a 55 y.o. (DOB: 1955-06-26)  w female who is a patient of Kerby Nora, MD, MD and comes to me today for lap band surgery follow up.  Has done well. Has seen Amy Henreitta Leber who has advanced diet.  No issues.   PHYSICAL EXAM: BP 142/90  Pulse 60  Temp(Src) 97.1 F (36.2 C) (Temporal)  Resp 18  Ht 5\' 4"  (1.626 m)  Wt 236 lb 8 oz (107.276 kg)  BMI 40.60 kg/m2  Abdmoen:  Wound looks good.  Port palpable.  BS present.  DATA REVIEWED: None  Ovidio Kin, MD, FACS Office:  [phone]

## 2011-06-21 ENCOUNTER — Encounter: Payer: Self-pay | Admitting: *Deleted

## 2011-06-21 ENCOUNTER — Ambulatory Visit: Payer: BC Managed Care – PPO | Admitting: *Deleted

## 2011-06-21 ENCOUNTER — Encounter: Payer: BC Managed Care – PPO | Attending: Surgery | Admitting: *Deleted

## 2011-06-21 DIAGNOSIS — Z09 Encounter for follow-up examination after completed treatment for conditions other than malignant neoplasm: Secondary | ICD-10-CM | POA: Insufficient documentation

## 2011-06-21 DIAGNOSIS — Z9884 Bariatric surgery status: Secondary | ICD-10-CM | POA: Insufficient documentation

## 2011-06-21 DIAGNOSIS — Z713 Dietary counseling and surveillance: Secondary | ICD-10-CM | POA: Insufficient documentation

## 2011-06-21 NOTE — Progress Notes (Signed)
  Follow-up visit: 8 Weeks Post-Operative LAGB Surgery  Medical Nutrition Therapy:  Appt start time: 1537 end time:  1607  Assessment:  Primary concerns today: post-operative bariatric surgery nutrition management.  Surgery date: 04/24/11  Surgery type: LAGB  Start wt @ [facility]: 258.4 lbs  Weight today: 221.1 lbs Weight change: 15.6 lbs Total weight lost: 37.3 lbs BMI: 38% Weight goal: 180 lbs  24-hr recall:  B (7-8 AM): 1 egg, 2 [place] sausage Snk (10  AM): SF pudding   L (12:30 PM): Chili with ground beef/beans Snk (4:30 PM): Cheese stick w/ deli meat (roll-up)  D (6:30-7 PM): Grilled chicken OR Pinto beans OR Chili (3-5 oz) Snk (PM): N/A  Fluid intake: water, Brisk SF Orangeade, Decaf coffee/tea =  48-64 oz Estimated total protein intake: 65-75g  Medications: No changes Supplementation: Taking calcium citrate, mvi, and biotin  Using straws: No Drinking while eating: No Hair loss: None reported Carbonated beverages: No N/V/D/C: No Last Lap-Band fill: No fill at this point  Recent physical activity:  3 times/week, Walking for 30 minutes +  Progress Towards Goal(s):  In progress.  Handouts given during visit include:  Phase 3B - High Protein + Non-Starchy Vegetables   Nutritional Diagnosis:  [place]-3.3 Overweight/obesity As related to recent LAGB surgery.  As evidenced by pt following bariatric surgery guidelines for continued weight loss.    Intervention:  Nutrition education.  Monitoring/Evaluation:  Dietary intake, exercise, lap band fills, and body weight. Follow up in 1-2 months for 3-4 month post-op visit.

## 2011-06-21 NOTE — Patient Instructions (Signed)
Goals:  Follow Phase 3B: High Protein + Non-Starchy Vegetables  Eat 3-6 small meals/snacks, every 3-5 hrs  Increase lean protein foods to meet 65-75g goal  Increase fluid intake to 64oz +  Avoid drinking 15 minutes before, during and 30 minutes after eating  Aim for >30 min of physical activity daily

## 2011-07-05 ENCOUNTER — Encounter (INDEPENDENT_AMBULATORY_CARE_PROVIDER_SITE_OTHER): Payer: BC Managed Care – PPO

## 2011-07-13 ENCOUNTER — Encounter (INDEPENDENT_AMBULATORY_CARE_PROVIDER_SITE_OTHER): Payer: Self-pay

## 2011-07-13 ENCOUNTER — Ambulatory Visit (INDEPENDENT_AMBULATORY_CARE_PROVIDER_SITE_OTHER): Payer: BC Managed Care – PPO | Admitting: Physician Assistant

## 2011-07-13 VITALS — BP 160/84 | HR 68 | Temp 97.8°F | Resp 18 | Ht 64.0 in | Wt 216.6 lb

## 2011-07-13 DIAGNOSIS — Z4651 Encounter for fitting and adjustment of gastric lap band: Secondary | ICD-10-CM

## 2011-07-13 NOTE — Patient Instructions (Signed)
Take clear liquids for the next 48 hours. Thin protein shakes are ok to start on Saturday evening. You may begin foods with the consistency of yogurt, cottage cheese, cream soups, etc. on Sunday. Call us if you have persistent vomiting or regurgitation, night cough or reflux symptoms. Return as scheduled or sooner if you notice no changes in hunger/portion sizes.   

## 2011-07-13 NOTE — Progress Notes (Signed)
  HISTORY: Elizabeth Lowery is a 56 y.o.female who received an AP-Large lap-band in December 2012 by Dr. Ezzard Standing. She comes in today for her first fill. She says she's been doing well since her last visit with Dr. Ezzard Standing but has noticed that her hunger has slowly increased as have her portion sizes. She's had three episodes of regurgitation since surgery which she attributes to not chewing adequately. She's walking on a regular basis and has recently joined a gym. She's lost 40 lbs since surgery.  VITAL SIGNS: Filed Vitals:   07/13/11 1026  BP: 160/84  Pulse: 68  Temp: 97.8 F (36.6 C)  Resp: 18    PHYSICAL EXAM: Physical exam reveals a very well-appearing 56 y.o.female in no apparent distress Neurologic: Awake, alert, oriented Psych: Bright affect, conversant Respiratory: Breathing even and unlabored. No stridor or wheezing Abdomen: Soft, nontender, nondistended to palpation. Incisions well-healed. No incisional hernias. Port easily palpated. Extremities: Atraumatic, good range of motion.  ASSESMENT: 56 y.o.  female  s/p AP-Large lap-band.   PLAN: The patient's port was accessed with a 20G Huber needle without difficulty. Clear fluid was aspirated and 2.0 mL saline was added to the port in accordance with [facility] lap band fill guidelines. The patient was able to swallow water without difficulty following the procedure and was instructed to take clear liquids for the next 24-48 hours and advance slowly as tolerated.

## 2011-08-06 ENCOUNTER — Encounter: Payer: BC Managed Care – PPO | Attending: Surgery | Admitting: *Deleted

## 2011-08-06 ENCOUNTER — Encounter: Payer: Self-pay | Admitting: *Deleted

## 2011-08-06 DIAGNOSIS — Z713 Dietary counseling and surveillance: Secondary | ICD-10-CM | POA: Insufficient documentation

## 2011-08-06 DIAGNOSIS — Z09 Encounter for follow-up examination after completed treatment for conditions other than malignant neoplasm: Secondary | ICD-10-CM | POA: Insufficient documentation

## 2011-08-06 DIAGNOSIS — Z9884 Bariatric surgery status: Secondary | ICD-10-CM | POA: Insufficient documentation

## 2011-08-06 NOTE — Progress Notes (Signed)
Follow-up visit: 3-4 Months Post-Operative LAGB Surgery   Medical Nutrition Therapy: Appt start time: 1537 end time: 1607   Assessment: Primary concerns today: post-operative bariatric surgery nutrition management. Elizabeth Lowery is doing GREAT since surgery. She notes that her most recent fill has been very easy to tolerate. She notes great portion control and adherence to a low-carb, high protein diet. She feels her portion sizes are still larger than where she would like them to be and could use another fill.  Surgery date: 04/24/11  Surgery type: LAGB  Start wt @ [facility]: 258.4 lbs   Weight today: 207.6 lbs  Weight change: 13.5 lbs  Total weight lost: 50.8 lbs  BMI: 35.7%  Weight goal: 150 lbs   24-hr recall:  B (8:30 AM): 1 egg, 2 pc [place] bacon Snk (AM):  N/A L (12:30 PM): Grilled chicken salad (4 oz protein) Snk (3 PM): Cheese/turkey roll- up OR [place] yogurt w/ pb (3 oz) D (6:30-7 PM): Grilled salmon (4-5 oz), 1/2 cup - 1 cup vegetable or salad Snk (PM): N/A   Fluid intake: water w/ lemon, Brisk SF Orangeade, Decaf coffee/tea = 48-64 oz  Estimated total protein intake: 65-75g   Medications: No changes  Supplementation: Taking calcium citrate, mvi, and biotin   Using straws: No  Drinking while eating: No  Hair loss: None reported  Carbonated beverages: No  N/V/D/C: No (Pt had the Norovirus and got sick due to this) Last Lap-Band fill: 2 cc fill per Mardelle Matte (to see Dr. Ezzard Standing this week)  Recent physical activity: 1 times/week, Walking for 30 minutes + ; Joined the gym and exercising there 3 times/week for 60-90 minutes  Progress Towards Goal(s): In progress.   Handouts given during visit include:  Phase 3B - High Protein + Non-Starchy Vegetables High Fiber Carbohydrate Foods  Nutritional Diagnosis:  [place]-3.3 Overweight/obesity As related to recent LAGB surgery. As evidenced by pt following bariatric surgery guidelines for continued weight loss.   Intervention: Nutrition education.    Monitoring/Evaluation: Dietary intake, exercise, lap band fills, and body weight. Follow up in 1-2 months for 3-4 month post-op visit.

## 2011-08-06 NOTE — Patient Instructions (Addendum)
Goals:  Follow Phase 3B: High Protein + Non-Starchy Vegetables        **Add 1/2 cup high fiber carbohydrate food  Eat 3-6 small meals/snacks, every 3-5 hrs  Increase lean protein foods to meet 65-75g goal  Increase fluid intake to 64oz +  Avoid drinking 15 minutes before, during and 30 minutes after eating  Aim for >30 min of physical activity daily

## 2011-08-09 ENCOUNTER — Ambulatory Visit (INDEPENDENT_AMBULATORY_CARE_PROVIDER_SITE_OTHER): Payer: BC Managed Care – PPO | Admitting: Surgery

## 2011-08-09 VITALS — BP 130/90 | HR 78 | Resp 18 | Ht 64.0 in | Wt 208.4 lb

## 2011-08-09 DIAGNOSIS — Z9884 Bariatric surgery status: Secondary | ICD-10-CM

## 2011-08-09 NOTE — Progress Notes (Signed)
[facility]  Ovidio Kin, MD,  FACS [address] [place]  [address] [place], [place]    [address] Phone:  [phone] FAX:  [phone]   Re:   Elizabeth Lowery DOB:   [birth year] MRN:   [id number]  ASSESSMENT AND PLAN: 1. Morbid Obesity. Initial weight - 257, BMI - 44.1   APLarge Lap Band - 04/24/2011 - by Dr. Algis Downs. Elizabeth Lowery.  Elizabeth Lowery saw for first visit.  He placed 2 cc.  She has done great. She is down about 50 pounds. I did not adjust her band today.  Return to office in 10 weeks.  2. History of both hips replaced. Dr. Charlann Boxer.  3. Hypercholesterolemia.  4. Negative GB eval February 2011.  HISTORY OF PRESENT ILLNESS: Chief Complaint  Patient presents with  . Lap Band Fill   Elizabeth Lowery is a 56 y.o. (DOB: 05-07-1956)  w female who is a patient of Kerby Nora, MD, MD and comes to me today for lap band surgery follow up.  Has done well. She has had food get caught only 1 or 2 times and she realized that she was eating too fast.  She is doing a very good job of exercising, getting in about 5 workouts per week. She actually notices that her band is tight at night than in the morning. She is happy with her current lap band status.  We have a goal weight of 180 pounds, but she wants to go lower.  PHYSICAL EXAM: BP 130/90  Pulse 78  Resp 18  Ht 5\' 4"  (1.626 m)  Wt 208 lb 6 oz (94.518 kg)  BMI 35.77 kg/m2  Lungs: Clear. Abdomen:  Wound looks good.  Port palpable.  BS present.  DATA REVIEWED: None  Ovidio Kin, MD, FACS Office:  [phone]

## 2011-09-10 ENCOUNTER — Telehealth: Payer: Self-pay

## 2011-09-10 NOTE — Telephone Encounter (Signed)
Pt had weight loss surgery 04/2011 and has lost 55 lbs. Pt already has appt to see Dr Ermalene Searing 11/30/11 but pts surgeon suggest discussing whether pt needs to take Simvastatin 40 mg since weight loss. Should pt schedule lab appt or does Dr Ermalene Searing want pt to wait till July appt. Pt can be reached at [phone].

## 2011-09-10 NOTE — Telephone Encounter (Signed)
i think fine to stop and recheck before physical in July

## 2011-09-11 NOTE — Telephone Encounter (Signed)
Patient advised and appt made for labs prior to cpx

## 2011-10-11 ENCOUNTER — Ambulatory Visit (INDEPENDENT_AMBULATORY_CARE_PROVIDER_SITE_OTHER): Payer: BC Managed Care – PPO | Admitting: Surgery

## 2011-10-11 VITALS — BP 118/82 | HR 66 | Resp 18 | Ht 64.0 in | Wt 199.5 lb

## 2011-10-11 DIAGNOSIS — Z9884 Bariatric surgery status: Secondary | ICD-10-CM

## 2011-10-11 NOTE — Progress Notes (Signed)
[facility]  Ovidio Kin, MD,  FACS [address] [place]  [address] [place], [place]    [address] Phone:  [phone] FAX:  [phone]   Re:   Elizabeth Lowery DOB:   [birth year] MRN:   [id number]  ASSESSMENT AND PLAN: 1. Morbid Obesity. Initial weight - 257, BMI - 44.1   APLarge Lap Band - 04/24/2011 - by Dr. Algis Downs. Elizabeth Lowery.  Elizabeth Lowery saw for first visit.  He placed 2 cc.  She has done great. She is down about 60 pounds. I did not adjust her band today.  Return to office in 8 weeks.  2. History of both hips replaced. Dr. Charlann Boxer.  3. Hypercholesterolemia.  4. Negative GB eval February 2011.  HISTORY OF PRESENT ILLNESS: Chief Complaint  Patient presents with  . Lap Band Fill   Elizabeth Lowery is a 56 y.o. (DOB: 01/23/1956)  w female who is a patient of Kerby Nora, MD, MD and comes to me today for lap band surgery follow up.  Has done well. She has noticed that she can eat a little more.  But still she has had some episodes where food has gotten caught. She is almost down to a size 12 clothing.  She is hoping to run in a 5 K this fall. We talked about exercise, but she seems to be doing a good job.  Her big concern is that she has a niece getting married in the next month and the temptation of food at all the parties.  The wedding is in [facility].  We have a goal weight of 180 pounds, but she wants to go lower.  PHYSICAL EXAM: BP 118/82  Pulse 66  Resp 18  Ht 5\' 4"  (1.626 m)  Wt 199 lb 8 oz (90.493 kg)  BMI 34.24 kg/m2  Lungs: Clear. Abdomen:  Wound looks good.  Port palpable.  BS present.  We decided not to adjust her today.  DATA REVIEWED: None  Ovidio Kin, MD, FACS Office:  [phone]

## 2011-11-12 ENCOUNTER — Encounter: Payer: BC Managed Care – PPO | Attending: Surgery | Admitting: *Deleted

## 2011-11-12 ENCOUNTER — Encounter: Payer: Self-pay | Admitting: *Deleted

## 2011-11-12 DIAGNOSIS — Z9884 Bariatric surgery status: Secondary | ICD-10-CM | POA: Insufficient documentation

## 2011-11-12 DIAGNOSIS — Z713 Dietary counseling and surveillance: Secondary | ICD-10-CM | POA: Insufficient documentation

## 2011-11-12 DIAGNOSIS — Z09 Encounter for follow-up examination after completed treatment for conditions other than malignant neoplasm: Secondary | ICD-10-CM | POA: Insufficient documentation

## 2011-11-12 NOTE — Patient Instructions (Addendum)
Goals:  Follow Phase 3B: High Protein + Non-Starchy Vegetables        **Add 1/2 cup high fiber carbohydrate food  Eat 3-6 small meals/snacks, every 3-5 hrs  Continue lean protein foods to meet 65-75g goal  Increase fluid intake to 64oz +  Avoid drinking 15 minutes before, during and 30 minutes after eating  Aim for >30 min of physical activity daily   NEXT WEIGHT GOAL: 185 lbs by Labor Day :)

## 2011-11-12 NOTE — Progress Notes (Signed)
Follow-up visit: 6 Months Post-Operative LAGB Surgery   Medical Nutrition Therapy: Appt start time: 1500 end time: 1530   Primary concerns today: Post-operative bariatric surgery nutrition management.  Elizabeth Lowery returns today for 6 month f/u. Reports she is doing well, but moving towards yellow zone. Can eat more at meals and starting to get a little hungry between meals. Is ok with this, but must be very aware and make herself stop. Still hesitant to add CHO. Reports no other problems or issues.   Surgery date: 04/24/11  Surgery type: LAGB  Start wt @ [facility]: 258.4 lbs   Weight today: 197.8 lbs  Weight change: 9.8 lbs  Total weight lost: 60.6 lbs  BMI: 33.9 kg/m^2 Weight goal: 150 lbs   24-hr recall:  B (8:30 AM): 1 egg/egg beater or yogurt, 2 pc [place] bacon, and 1/2 slice whole wheat bread Snk (AM):  N/A L (12:30 PM): Grilled chicken salad (4 oz protein) OR sm Hazelynn's chili and side salad Snk (3 PM): Cheese/turkey roll-up OR Greek yogurt w/ pb (3 oz) D (6:30-7 PM): Grilled salmon (4-5 oz), 1/2-1 cup vegetable or salad Snk (PM): N/A   Fluid intake: Water w/ lemon, Brisk SF Orangeade, Decaf coffee/tea =  64 oz  Estimated total protein intake: 65-75g   Medications: No changes  Supplementation: Taking calcium citrate, MVI, and biotin   Using straws: No  Drinking while eating: No  Hair loss: None reported  Carbonated beverages: No  N/V/D/C: No; Tuna fish gets stuck more easily Last Lap-Band fill: No fill since last visit  Recent physical activity:  Walks for 60 minutes 4 times/week  Progress Towards Goal(s): In progress.   Nutritional Diagnosis:  [place]-3.3 Overweight/obesity  related to recent LAGB surgery as evidenced by pt following post-op bariatric surgery guidelines for continued weight loss.   Intervention: Nutrition education/reinforcement.   Samples dispensed at visit:  Bariatric Advantage Calcium Citrate (Cinnamon): 15 ea - 5 days worth Lot # [id number] Exp:  09/13  Monitoring/Evaluation: Dietary intake, exercise, lap band fills, and body weight. Follow up in 6 months for 12 month post-op visit.

## 2011-11-20 ENCOUNTER — Telehealth: Payer: Self-pay | Admitting: Family Medicine

## 2011-11-20 DIAGNOSIS — E785 Hyperlipidemia, unspecified: Secondary | ICD-10-CM

## 2011-11-20 NOTE — Telephone Encounter (Signed)
Message copied by Excell Seltzer on Tue Nov 20, 2011 10:06 AM ------      Message from: Baldomero Lamy      Created: Mon Nov 19, 2011  1:42 PM      Regarding: Cpx labs Mon 7/8       Please order  future cpx labs for pt's upcomming lab appt.      Thanks      Rodney Booze

## 2011-11-26 ENCOUNTER — Other Ambulatory Visit (INDEPENDENT_AMBULATORY_CARE_PROVIDER_SITE_OTHER): Payer: BC Managed Care – PPO

## 2011-11-26 DIAGNOSIS — E785 Hyperlipidemia, unspecified: Secondary | ICD-10-CM

## 2011-11-26 LAB — LIPID PANEL
HDL: 48.2 mg/dL (ref >39.00)
VLDL: 15.6 mg/dL (ref 0.0–40.0)

## 2011-11-26 LAB — COMPREHENSIVE METABOLIC PANEL
ALT: 19 U/L (ref 0–35)
AST: 19 U/L (ref 0–37)
CO2: 26 mEq/L (ref 19–32)
Calcium: 9.4 mg/dL (ref 8.4–10.5)
Chloride: 108 mEq/L (ref 96–112)
GFR: 74.03 mL/min (ref >60.00)
Potassium: 4.1 mEq/L (ref 3.5–5.1)
Sodium: 142 mEq/L (ref 135–145)
Total Protein: 7 g/dL (ref 6.0–8.3)

## 2011-11-26 LAB — LDL CHOLESTEROL, DIRECT: Direct LDL: 131.4 mg/dL

## 2011-11-30 ENCOUNTER — Encounter: Payer: Self-pay | Admitting: Family Medicine

## 2011-11-30 ENCOUNTER — Ambulatory Visit (INDEPENDENT_AMBULATORY_CARE_PROVIDER_SITE_OTHER): Payer: BC Managed Care – PPO | Admitting: Family Medicine

## 2011-11-30 VITALS — BP 116/74 | HR 63 | Temp 98.7°F | Ht 64.0 in | Wt 197.2 lb

## 2011-11-30 DIAGNOSIS — Z0000 Encounter for general adult medical examination without abnormal findings: Secondary | ICD-10-CM

## 2011-11-30 DIAGNOSIS — Z1231 Encounter for screening mammogram for malignant neoplasm of breast: Secondary | ICD-10-CM

## 2011-11-30 DIAGNOSIS — Z01419 Encounter for gynecological examination (general) (routine) without abnormal findings: Secondary | ICD-10-CM

## 2011-11-30 DIAGNOSIS — Z780 Asymptomatic menopausal state: Secondary | ICD-10-CM

## 2011-11-30 DIAGNOSIS — K760 Fatty (change of) liver, not elsewhere classified: Secondary | ICD-10-CM

## 2011-11-30 DIAGNOSIS — K7689 Other specified diseases of liver: Secondary | ICD-10-CM

## 2011-11-30 NOTE — Patient Instructions (Addendum)
Stay off cholesterol medication. Continue work on weight loss, low cholesterol heart healthy diet and exercise.  Stop at front desk to set up mammogram and bone density.

## 2011-11-30 NOTE — Assessment & Plan Note (Signed)
Resolved after lab band surgery, LFTs nml.

## 2011-11-30 NOTE — Progress Notes (Signed)
The patient is here for annual wellness exam and preventative care.   Elevated Cholesterol: At goal <130 off simvastatin.  Lab Results  Component Value Date   CHOL 204* 11/26/2011   HDL 48.20 11/26/2011   LDLCALC 112* 11/27/2010   LDLDIRECT 131.4 11/26/2011   TRIG 78.0 11/26/2011   CHOLHDL 4 11/26/2011    Using medications without problems:None.  Muscle aches: None  Other complaints:  Did have eval for gallstones on last year...  Pain is resolved.  Headaches are better since weight loss, BPs running well.  Had lap band surgery last year 04/2011.Marland Kitchen Has lost 60 lbs.. She continues to lose weight. On high protein low carb diet. exercisnig 4 times a week: walking 3 miles.  Feels much better overall with weight loss. Moving better.  Mother with osteoporosis.Marland Kitchenmenses at age 59,  Menopause age 51. No prednisone Bone density nml in 2009ish   Review of Systems  Constitutional: Negative for fever and fatigue.  HENT: Negative for ear pain.  Eyes: Negative for pain.  Respiratory: Negative for shortness of breath and wheezing.  Cardiovascular: Negative for chest pain, palpitations and leg swelling.  Gastrointestinal: Negative for diarrhea, constipation, blood in stool and abdominal distention.  Genitourinary: Negative for dysuria, vaginal bleeding, vaginal discharge, vaginal pain and pelvic pain.  Skin: Negative for rash.  Psychiatric/Behavioral: Negative for dysphoric mood and agitation.    Objective:   Physical Exam  Constitutional: Vital signs are normal. She appears well-developed and well-nourished. She is cooperative. Non-toxic appearance. She does not appear ill. No distress.  HENT:  Head: Normocephalic.  Right Ear: Hearing, tympanic membrane, external ear and ear canal normal.  Left Ear: Hearing, tympanic membrane, external ear and ear canal normal.  Nose: Nose normal.  Eyes: Conjunctivae, EOM and lids are normal. Pupils are equal, round, and reactive to light. No foreign bodies found.    Neck: Trachea normal and normal range of motion. Neck supple. Carotid bruit is not present. No mass and no thyromegaly present.  Cardiovascular: Normal rate, regular rhythm, S1 normal, S2 normal, normal heart sounds and intact distal pulses. Exam reveals no gallop.  No murmur heard.  Pulmonary/Chest: Effort normal and breath sounds normal. No respiratory distress. She has no wheezes. She has no rhonchi. She has no rales.  Abdominal: Soft. Normal appearance and bowel sounds are normal. She exhibits no distension, no fluid wave, no abdominal bruit and no mass. There is no hepatosplenomegaly. There is no tenderness. There is no rebound, no guarding and no CVA tenderness. No hernia.  Genitourinary: Vagina normal and uterus normal. No breast swelling, tenderness, discharge or bleeding. Pelvic exam was performed with patient prone. There is no rash, tenderness or lesion on the right labia. There is no rash, tenderness or lesion on the left labia. Uterus is not enlarged and not tender.  Right adnexum displays no mass, no tenderness and no fullness. Left adnexum displays no mass, no tenderness and no fullness.  Lymphadenopathy:  She has no cervical adenopathy.  She has no axillary adenopathy.  Neurological: She is alert. She has normal strength. No cranial nerve deficit or sensory deficit.  Skin: Skin is warm, dry and intact. No rash noted.  Psychiatric: Her speech is normal and behavior is normal. Judgment normal. Her mood appears not anxious. Cognition and memory are normal. She does not exhibit a depressed mood.    Assessment & Plan:   Complete Physical Exam: The patient's preventative maintenance and recommended screening tests for an annual wellness exam were reviewed  in full today.  Brought up to date unless services declined.  Counselled on the importance of diet, exercise, and its role in overall health and mortality.  The patient's FH and SH was reviewed, including their home life, tobacco  status, and drug and alcohol status.   PAP,  every 3 years following given normals >3 in a row. Last done 2012 DVE exam done yearly. Mammo due Colon, last nml in 2006:  due In 2016.  Vaccines: Td UptoDate. DEXA: will schedule given mother with osteoporosis

## 2011-12-05 ENCOUNTER — Encounter (INDEPENDENT_AMBULATORY_CARE_PROVIDER_SITE_OTHER): Payer: Self-pay | Admitting: General Surgery

## 2011-12-06 ENCOUNTER — Encounter (INDEPENDENT_AMBULATORY_CARE_PROVIDER_SITE_OTHER): Payer: Self-pay

## 2011-12-06 ENCOUNTER — Ambulatory Visit (INDEPENDENT_AMBULATORY_CARE_PROVIDER_SITE_OTHER): Payer: BC Managed Care – PPO | Admitting: Physician Assistant

## 2011-12-06 VITALS — BP 106/64 | Ht 64.0 in | Wt 197.6 lb

## 2011-12-06 DIAGNOSIS — Z4651 Encounter for fitting and adjustment of gastric lap band: Secondary | ICD-10-CM

## 2011-12-06 NOTE — Patient Instructions (Signed)
Take clear liquids tonight. Thin protein shakes are ok to start tomorrow morning. Slowly advance your diet thereafter. Call us if you have persistent vomiting or regurgitation, night cough or reflux symptoms. Return as scheduled or sooner if you notice no changes in hunger/portion sizes.  

## 2011-12-06 NOTE — Progress Notes (Signed)
  HISTORY: Shiah Berhow is a 56 y.o.female who received an AP-Large lap-band in December 2012 by Dr. Ezzard Standing. She comes in with increasing hunger and portion sizes since her last visit. She's noticed a decrease in her rate of weight loss. No vomiting or reflux.  VITAL SIGNS: Filed Vitals:   12/06/11 1005  BP: 106/64    PHYSICAL EXAM: Physical exam reveals a very well-appearing 56 y.o.female in no apparent distress Neurologic: Awake, alert, oriented Psych: Bright affect, conversant Respiratory: Breathing even and unlabored. No stridor or wheezing Abdomen: Soft, nontender, nondistended to palpation. Incisions well-healed. No incisional hernias. Port easily palpated. Extremities: Atraumatic, good range of motion.  ASSESMENT: 56 y.o.  female  s/p AP-Large lap-band.   PLAN: The patient's port was accessed with a 20G Huber needle without difficulty. Clear fluid was aspirated and 1 mL saline was added to the port to give a total predicted volume of 3 mL. The patient was able to swallow water without difficulty following the procedure and was instructed to take clear liquids for the next 24-48 hours and advance slowly as tolerated.

## 2011-12-31 ENCOUNTER — Encounter: Payer: Self-pay | Admitting: Family Medicine

## 2011-12-31 LAB — HM MAMMOGRAPHY: HM Mammogram: NORMAL

## 2012-01-01 ENCOUNTER — Encounter: Payer: Self-pay | Admitting: *Deleted

## 2012-01-10 ENCOUNTER — Encounter (INDEPENDENT_AMBULATORY_CARE_PROVIDER_SITE_OTHER): Payer: Self-pay

## 2012-01-10 ENCOUNTER — Encounter: Payer: Self-pay | Admitting: Family Medicine

## 2012-01-10 ENCOUNTER — Ambulatory Visit (INDEPENDENT_AMBULATORY_CARE_PROVIDER_SITE_OTHER): Payer: BC Managed Care – PPO | Admitting: Physician Assistant

## 2012-01-10 DIAGNOSIS — Z9884 Bariatric surgery status: Secondary | ICD-10-CM

## 2012-01-10 NOTE — Progress Notes (Signed)
  HISTORY: Elizabeth Lowery is a 56 y.o.female who received an AP-Large lap-band in December 2012 by Dr. Ezzard Lowery. She comes in with good hunger control, small portion sizes and continued weight loss since her last visit. She denies persistent regurgitation symptoms or reflux.  VITAL SIGNS: Filed Vitals:   01/10/12 1008  BP: 122/74  Pulse: 45  Temp: 97.8 F (36.6 C)    PHYSICAL EXAM: Physical exam reveals a very well-appearing 56 y.o.female in no apparent distress Neurologic: Awake, alert, oriented Psych: Bright affect, conversant Respiratory: Breathing even and unlabored. No stridor or wheezing Extremities: Atraumatic, good range of motion. Skin: Warm, Dry, no rashes Musculoskeletal: Normal gait, Joints normal  ASSESMENT: 56 y.o.  female  s/p AP-Large lap-band.   PLAN: It appears that she's in the green zone and addition of more fluid may render her with obstructive symptoms. We agreed to keep an eye on things for the next month and have her return for re-evaluation then.

## 2012-01-10 NOTE — Patient Instructions (Signed)
Return in a month or sooner if needed, especially if you have increasing hunger, increasing portion sizes, weight gain or difficulty swallowing.

## 2012-01-15 ENCOUNTER — Encounter: Payer: Self-pay | Admitting: Family Medicine

## 2012-01-15 ENCOUNTER — Encounter: Payer: Self-pay | Admitting: *Deleted

## 2012-01-15 LAB — HM DEXA SCAN: HM Dexa Scan: NORMAL

## 2012-02-07 ENCOUNTER — Encounter (INDEPENDENT_AMBULATORY_CARE_PROVIDER_SITE_OTHER): Payer: Self-pay

## 2012-02-07 ENCOUNTER — Ambulatory Visit (INDEPENDENT_AMBULATORY_CARE_PROVIDER_SITE_OTHER): Payer: BC Managed Care – PPO | Admitting: Physician Assistant

## 2012-02-07 VITALS — BP 118/72 | HR 76 | Temp 98.4°F | Resp 20 | Ht 64.0 in | Wt 192.6 lb

## 2012-02-07 DIAGNOSIS — Z4651 Encounter for fitting and adjustment of gastric lap band: Secondary | ICD-10-CM

## 2012-02-07 NOTE — Progress Notes (Signed)
  HISTORY: Elizabeth Lowery is a 56 y.o.female who received an AP-Large lap-band in December 2012 by Dr. Ezzard Standing. She comes in with stable hunger but has noticed that she's eating more than desired. She denies persistent regurgitation or reflux. She'd like a fill today.  VITAL SIGNS: Filed Vitals:   02/07/12 1057  BP: 118/72  Pulse: 76  Temp: 98.4 F (36.9 C)  Resp: 20    PHYSICAL EXAM: Physical exam reveals a very well-appearing 56 y.o.female in no apparent distress Neurologic: Awake, alert, oriented Psych: Bright affect, conversant Respiratory: Breathing even and unlabored. No stridor or wheezing Abdomen: Soft, nontender, nondistended to palpation. Incisions well-healed. No incisional hernias. Port easily palpated. Extremities: Atraumatic, good range of motion.  ASSESMENT: 56 y.o.  female  s/p AP-Large lap-band.   PLAN: The patient's port was accessed with a 20G Huber needle without difficulty. Clear fluid was aspirated and 0.5 mL saline was added to the port to give a total predicted volume of 3.5 mL. The patient was able to swallow water without difficulty following the procedure and was instructed to take clear liquids for the next 24-48 hours and advance slowly as tolerated.

## 2012-02-07 NOTE — Patient Instructions (Signed)
Take clear liquids tonight. Thin protein shakes are ok to start tomorrow morning. Slowly advance your diet thereafter. Call us if you have persistent vomiting or regurgitation, night cough or reflux symptoms. Return as scheduled or sooner if you notice no changes in hunger/portion sizes.  

## 2012-02-28 ENCOUNTER — Ambulatory Visit: Payer: BC Managed Care – PPO

## 2012-03-04 ENCOUNTER — Encounter: Payer: Self-pay | Admitting: Family Medicine

## 2012-03-04 ENCOUNTER — Ambulatory Visit (INDEPENDENT_AMBULATORY_CARE_PROVIDER_SITE_OTHER): Payer: BC Managed Care – PPO | Admitting: Family Medicine

## 2012-03-04 VITALS — BP 122/78 | HR 64 | Temp 98.2°F | Wt 190.5 lb

## 2012-03-04 DIAGNOSIS — M533 Sacrococcygeal disorders, not elsewhere classified: Secondary | ICD-10-CM

## 2012-03-04 DIAGNOSIS — Z23 Encounter for immunization: Secondary | ICD-10-CM

## 2012-03-04 MED ORDER — MELOXICAM 7.5 MG PO TABS: 7.5000 mg | ORAL_TABLET | Freq: Every day | ORAL | Status: DC

## 2012-03-04 NOTE — Patient Instructions (Signed)
Start meloxicam for pain daily. If stomach irritation can take prilosec with it or tylenol instead.  Use donut pillow. Do not sleep on back if pain occuring in morning.

## 2012-03-04 NOTE — Addendum Note (Signed)
Addended by: Sydell Axon C on: 03/04/2012 12:57 PM   Modules accepted: Orders

## 2012-03-04 NOTE — Progress Notes (Signed)
  Subjective:    Patient ID: Elizabeth Lowery, female    DOB: 1955-07-16, 56 y.o.   MRN: [id number]  HPI   56 year old female overweight presents with  Tailbone pain following sleeping on friends mattress 5 days. Also had pain over right buttock after waking up so must of slept wrong.  Sore  and burning over cocyx after sitting on it or more active. No fall. No fever. Pain is somewhat better today.  Using tylenol for pain.    Review of Systems  Constitutional: Negative for fever, chills, appetite change, fatigue and unexpected weight change.  Cardiovascular: Negative for chest pain.  Musculoskeletal: Negative for myalgias, back pain and joint swelling.  Skin: Negative for rash.       Objective:   Physical Exam  Constitutional: She appears well-developed and well-nourished.  Eyes: Conjunctivae normal are normal. Pupils are equal, round, and reactive to light.  Neck: Normal range of motion. Neck supple.  Cardiovascular: Normal rate and regular rhythm.   Pulmonary/Chest: Effort normal and breath sounds normal.  Musculoskeletal:       Thoracic back: Normal.       Lumbar back: Normal.       Focal ttp over coccyx  neg faber's          Assessment & Plan:

## 2012-03-04 NOTE — Assessment & Plan Note (Signed)
No red flags. Doubt fracture given no fall. Likely ligamentous strain. Treat with NSAIDs, donut pillow.  Call if not continuing to improve as expected.

## 2012-03-06 ENCOUNTER — Encounter (INDEPENDENT_AMBULATORY_CARE_PROVIDER_SITE_OTHER): Payer: Self-pay

## 2012-03-06 ENCOUNTER — Ambulatory Visit (INDEPENDENT_AMBULATORY_CARE_PROVIDER_SITE_OTHER): Payer: BC Managed Care – PPO | Admitting: Physician Assistant

## 2012-03-06 VITALS — BP 130/84 | HR 75 | Temp 97.9°F | Ht 64.0 in | Wt 189.8 lb

## 2012-03-06 DIAGNOSIS — Z4651 Encounter for fitting and adjustment of gastric lap band: Secondary | ICD-10-CM

## 2012-03-06 NOTE — Patient Instructions (Signed)
Take clear liquids tonight. Thin protein shakes are ok to start tomorrow morning. Slowly advance your diet thereafter. Call us if you have persistent vomiting or regurgitation, night cough or reflux symptoms. Return as scheduled or sooner if you notice no changes in hunger/portion sizes.  

## 2012-03-06 NOTE — Progress Notes (Signed)
  HISTORY: Elizabeth Lowery is a 56 y.o.female who received an AP-Large lap-band in December 2012 by Dr. Ezzard Standing. She comes in with three pounds of weight loss since last month. She is walking regularly but has had some coccygeal pain that's slowing her down a bit. She's seeing her PCP for this. She denies regurgitation or reflux. She reports her last adjustment did give more restriction but she notes her hunger increasing somewhat as well as her portion sizes. She'd like an adjustment today.  VITAL SIGNS: Filed Vitals:   03/06/12 0925  BP: 130/84  Pulse: 75  Temp: 97.9 F (36.6 C)    PHYSICAL EXAM: Physical exam reveals a very well-appearing 56 y.o.female in no apparent distress Neurologic: Awake, alert, oriented Psych: Bright affect, conversant Respiratory: Breathing even and unlabored. No stridor or wheezing Abdomen: Soft, nontender, nondistended to palpation. Incisions well-healed. No incisional hernias. Port easily palpated. Extremities: Atraumatic, good range of motion.  ASSESMENT: 56 y.o.  female  s/p AP-Large lap-band.   PLAN: The patient's port was accessed with a 20G Huber needle without difficulty. Clear fluid was aspirated and 0.5 mL saline was added to the port to give a total predicted volume of 4 mL. The patient was able to swallow water without difficulty following the procedure and was instructed to take clear liquids for the next 24-48 hours and advance slowly as tolerated.

## 2012-04-10 ENCOUNTER — Ambulatory Visit (INDEPENDENT_AMBULATORY_CARE_PROVIDER_SITE_OTHER): Payer: BC Managed Care – PPO | Admitting: Physician Assistant

## 2012-04-10 ENCOUNTER — Encounter (INDEPENDENT_AMBULATORY_CARE_PROVIDER_SITE_OTHER): Payer: Self-pay

## 2012-04-10 DIAGNOSIS — Z9884 Bariatric surgery status: Secondary | ICD-10-CM

## 2012-04-10 NOTE — Progress Notes (Signed)
  HISTORY: Elizabeth Lowery is a 56 y.o.female who received an AP-Standard lap-band in December 2012 by Dr. Ezzard Standing. She comes in with improvement in hunger and smaller portion sizes. She has no regurgitation unless she eats too quickly. She says her exercise has been less this past month due to her mother being hospitalized but now she's back on track. She doesn't believe a fill is needed today.  VITAL SIGNS: Filed Vitals:   04/10/12 1008  BP: 104/76  Pulse: 70  Temp: 97.7 F (36.5 C)    PHYSICAL EXAM: Physical exam reveals a very well-appearing 56 y.o.female in no apparent distress Neurologic: Awake, alert, oriented Psych: Bright affect, conversant Respiratory: Breathing even and unlabored. No stridor or wheezing Extremities: Atraumatic, good range of motion. Skin: Warm, Dry, no rashes Musculoskeletal: Normal gait, Joints normal  ASSESMENT: 56 y.o.  female  s/p AP-Standard lap-band.   PLAN: We'll have her back in one month or sooner if needed.

## 2012-04-10 NOTE — Patient Instructions (Signed)
Return in one month. Focus on good food choices as well as physical activity. Return sooner if you have an increase in hunger, portion sizes or weight. Return also for difficulty swallowing, night cough, reflux.   

## 2012-05-12 ENCOUNTER — Encounter: Payer: BC Managed Care – PPO | Attending: Surgery | Admitting: *Deleted

## 2012-05-12 ENCOUNTER — Encounter: Payer: Self-pay | Admitting: *Deleted

## 2012-05-12 DIAGNOSIS — Z713 Dietary counseling and surveillance: Secondary | ICD-10-CM | POA: Insufficient documentation

## 2012-05-12 DIAGNOSIS — Z9884 Bariatric surgery status: Secondary | ICD-10-CM | POA: Insufficient documentation

## 2012-05-12 DIAGNOSIS — Z09 Encounter for follow-up examination after completed treatment for conditions other than malignant neoplasm: Secondary | ICD-10-CM | POA: Insufficient documentation

## 2012-05-12 NOTE — Progress Notes (Signed)
Follow-up visit: 12 Months Post-Operative LAGB Surgery   Medical Nutrition Therapy: Appt start time: 1505 end time: 1525   Primary concerns today: Post-operative bariatric surgery nutrition management.  Elizabeth Lowery returns today for 12 month f/u. Reports in the last few weeks has developed less of an appetite, though still getting her protein and fluids in.  States she is in the green zone and reports no problems.   Surgery date: 04/24/11  Surgery type: LAGB  Start wt @ [facility]: 258.4 lbs   Weight today: 185.7 lbs  Weight change: 12.1 lbs  Total weight lost: 72.7 lbs  BMI: 31.9 kg/m^2  Weight goal: 150 lbs  % goal met: 67%  24-hr recall:  B (8:30 AM): 1 egg/egg beater or yogurt w/ small amt of peanut butter, 2 pc [place] bacon, and 1/2 slice whole wheat bread Snk (AM):  N/A L (12:30 PM): Grilled chicken salad (4 oz protein) OR sm [employer] chili and side salad Snk (3 PM): Cheese/turkey roll-up OR [place] yogurt w/ pb (3 oz) OR peanuts (1 oz) OR string cheese D (6:30-7 PM): Grilled salmon or chicken (4-5 oz), 1/2-1 cup vegetable or salad Snk (PM): N/A   Fluid intake: Water w/ lemon, Diet Green Tea Decaf coffee/tea =  64 oz  Estimated total protein intake: 65-75g   Medications: No longer taking Mobic  Supplementation: Taking calcium citrate, MVI, and biotin   Using straws: No  Drinking while eating: No  Hair loss: No Carbonated beverages: No  N/V/D/C: None reported Last Lap-Band fill: 03/06/12  Recent physical activity:  Walks for 60 minutes (3 miles) 3-4 times/week  Progress Towards Goal(s): In progress.   Nutritional Diagnosis:  [place]-3.3 Overweight/obesity  related to recent LAGB surgery as evidenced by pt following post-op bariatric surgery guidelines for continued weight loss.   Intervention: Nutrition education/reinforcement.   Monitoring/Evaluation: Dietary intake, exercise, lap band fills, and body weight. Follow up in 6 months for 18 month post-op visit.

## 2012-05-12 NOTE — Patient Instructions (Addendum)
Goals:  Follow Phase 3B: High Protein + Non-Starchy Vegetables        **Add 1/2 cup high fiber carbohydrate food  Eat 3-6 small meals/snacks, every 3-5 hrs  Continue lean protein foods to meet 65-75g goal  Increase fluid intake to 64oz +  Avoid drinking 15 minutes before, during and 30 minutes after eating  Aim for >30 min of physical activity daily

## 2012-05-15 ENCOUNTER — Encounter (INDEPENDENT_AMBULATORY_CARE_PROVIDER_SITE_OTHER): Payer: BC Managed Care – PPO

## 2012-05-22 ENCOUNTER — Ambulatory Visit (INDEPENDENT_AMBULATORY_CARE_PROVIDER_SITE_OTHER): Payer: BC Managed Care – PPO | Admitting: Physician Assistant

## 2012-05-22 ENCOUNTER — Encounter (INDEPENDENT_AMBULATORY_CARE_PROVIDER_SITE_OTHER): Payer: Self-pay

## 2012-05-22 VITALS — BP 122/80 | HR 60 | Temp 97.2°F | Resp 16 | Ht 64.0 in | Wt 186.2 lb

## 2012-05-22 DIAGNOSIS — Z4651 Encounter for fitting and adjustment of gastric lap band: Secondary | ICD-10-CM

## 2012-05-22 NOTE — Progress Notes (Signed)
  HISTORY: Elizabeth Lowery is a 57 y.o.female who received an AP-Large lap-band in December 2012 by Dr. Ezzard Standing. She comes in with almost two pounds of weight loss since her last visit at the end of November. She denies hunger and she says her portion sizes remain small. She really doesn't want to eat less. She's getting her proteins in without difficulty. She's also exercising regularly and she feels bad when she doesn't get any physical activity in.  VITAL SIGNS: Filed Vitals:   05/22/12 1150  BP: 122/80  Pulse: 60  Temp: 97.2 F (36.2 C)  Resp: 16    PHYSICAL EXAM: Physical exam reveals a very well-appearing 57 y.o.female in no apparent distress Neurologic: Awake, alert, oriented Psych: Bright affect, conversant Respiratory: Breathing even and unlabored. No stridor or wheezing Extremities: Atraumatic, good range of motion. Skin: Warm, Dry, no rashes Musculoskeletal: Normal gait, Joints normal  ASSESMENT: 57 y.o.  female  s/p AP-Large lap-band.   PLAN: We deferred an adjustment today as she's in the green zone. I recommended that she return in three months. We talked about criteria that should trigger her to return sooner including increased portion sizes, increased hunger or weight gain. She voiced understanding and agreement.

## 2012-05-22 NOTE — Patient Instructions (Signed)
Return in three months. Focus on good food choices as well as physical activity. Return sooner if you have an increase in hunger, portion sizes or weight. Return also for difficulty swallowing, night cough, reflux.   

## 2012-08-21 ENCOUNTER — Encounter (INDEPENDENT_AMBULATORY_CARE_PROVIDER_SITE_OTHER): Payer: Self-pay

## 2012-08-21 ENCOUNTER — Ambulatory Visit (INDEPENDENT_AMBULATORY_CARE_PROVIDER_SITE_OTHER): Payer: BC Managed Care – PPO | Admitting: Physician Assistant

## 2012-08-21 VITALS — BP 128/82 | HR 76 | Temp 97.2°F | Resp 18 | Ht 64.0 in | Wt 182.8 lb

## 2012-08-21 DIAGNOSIS — Z4651 Encounter for fitting and adjustment of gastric lap band: Secondary | ICD-10-CM

## 2012-08-21 NOTE — Patient Instructions (Signed)
Take clear liquids tonight. Thin protein shakes are ok to start tomorrow morning. Slowly advance your diet thereafter. Call us if you have persistent vomiting or regurgitation, night cough or reflux symptoms. Return as scheduled or sooner if you notice no changes in hunger/portion sizes.  

## 2012-08-21 NOTE — Progress Notes (Signed)
  HISTORY: Elizabeth Lowery is a 57 y.o.female who received an AP-Large lap-band in December 2012 by Dr. Ezzard Standing. She's lost 3.4 lbs since her last visit in early January. She has noticed her portion sizes getting slightly bigger but her hunger isn't out of control. She has started exercising again since the weather has improved. She would like a small fill today to help her progress further in weight loss. She denies persistent regurgitation or reflux.  VITAL SIGNS: Filed Vitals:   08/21/12 0941  BP: 128/82  Pulse: 76  Temp: 97.2 F (36.2 C)  Resp: 18    PHYSICAL EXAM: Physical exam reveals a very well-appearing 57 y.o.female in no apparent distress Neurologic: Awake, alert, oriented Psych: Bright affect, conversant Respiratory: Breathing even and unlabored. No stridor or wheezing Abdomen: Soft, nontender, nondistended to palpation. Incisions well-healed. No incisional hernias. Port easily palpated. Extremities: Atraumatic, good range of motion.  ASSESMENT: 57 y.o.  female  s/p AP-Large lap-band.   PLAN: The patient's port was accessed with a 20G Huber needle without difficulty. Clear fluid was aspirated and 0.25 mL saline was added to the port to give a total predicted volume of 4.25 mL. The patient was able to swallow water without difficulty following the procedure and was instructed to take clear liquids for the next 24-48 hours and advance slowly as tolerated.

## 2012-11-10 ENCOUNTER — Encounter: Payer: Self-pay | Admitting: *Deleted

## 2012-11-10 ENCOUNTER — Encounter: Payer: BC Managed Care – PPO | Attending: Surgery | Admitting: *Deleted

## 2012-11-10 DIAGNOSIS — Z713 Dietary counseling and surveillance: Secondary | ICD-10-CM | POA: Insufficient documentation

## 2012-11-10 DIAGNOSIS — Z09 Encounter for follow-up examination after completed treatment for conditions other than malignant neoplasm: Secondary | ICD-10-CM | POA: Insufficient documentation

## 2012-11-10 NOTE — Patient Instructions (Addendum)
Goals:  Follow Phase 3B: High Protein + Non-Starchy Vegetables        **Add 1/2 cup high fiber carbohydrate food  Eat 3-6 small meals/snacks, every 3-5 hrs  Continue lean protein foods to meet 65-75g goal    *Try [employer] bars or [facility] protein bars for variety Increase fluid intake to 64oz +  Aim for >30 min of physical activity daily

## 2012-11-10 NOTE — Progress Notes (Signed)
Follow-up visit: 18 Months Post-Operative LAGB Surgery   Medical Nutrition Therapy: Appt start time: 1500  End time: 1530   Primary concerns today: Post-operative bariatric surgery nutrition management.  Elizabeth Lowery returns today for 18 month f/u with an additional 7 lb wt loss. She is now in the 170s. States she feels she is moving to the border of the yellow/green zone. States she cannot tolerate tuna fish, even with lite mayo. Sounds likely that her food bolus is too large or too dry. Plans to watch this next time she tries it and will add mustard instead. Had stopped exercising over the winter, but has now resumed.   Surgery date: 04/24/11  Surgery type: LAGB  Start wt @ [facility]: 258.4 lbs (02/07/11)  Weight today: 178.7 lbs  Weight change: 7.0 lbs  Total weight lost: 79.7 lbs  BMI: 30.7 kg/m^2  Weight goal: 150 lbs  % goal met: 74%  Dietary intake: Atkins bars and string cheese for snacks, all protein for breakfast (no shakes), salads for lunch  Fluid intake: Water w/ lemon, Diet Green Tea, Decaf or 1/2 & 1/2 coffee =  64 oz  Estimated total protein intake:  60+ g   Medications: No changes  Supplementation: Taking calcium citrate, MVI, Vit D, and biotin   Using straws: No  Drinking while eating: No  Hair loss: No Carbonated beverages: No  N/V/D/C: None reported Last Lap-Band fill:  08/21/12 - 0.25 cc = 4.25 cc total. Feels on border of green/yellow. Plans to return to [place] in July to discuss.    Recent physical activity:  Gym 3 days a week - 45 min treadmill/15-20 min strength training  Progress Towards Goal(s): In progress.   Nutritional Diagnosis:  [place]-3.3 Overweight/obesity related to recent LAGB surgery as evidenced by patient following post-op bariatric surgery guidelines for continued weight loss.   Intervention: Nutrition education/reinforcement.   Monitoring/Evaluation: Dietary intake, exercise, lap band fills, and body weight. Follow up prn.

## 2012-11-20 ENCOUNTER — Encounter (INDEPENDENT_AMBULATORY_CARE_PROVIDER_SITE_OTHER): Payer: BC Managed Care – PPO

## 2012-11-23 ENCOUNTER — Telehealth: Payer: Self-pay | Admitting: Family Medicine

## 2012-11-23 NOTE — Telephone Encounter (Signed)
Message copied by Judy Pimple on Sun Nov 23, 2012  8:56 PM ------      Message from: Alvina Chou      Created: Tue Nov 18, 2012  2:04 PM      Regarding: Lab orders for Monday, 7.7.14       Patient is scheduled for CPX labs, please order future labs, Thanks , Terri       ------

## 2012-11-23 NOTE — Telephone Encounter (Signed)
Dr Bedsole's pt? 

## 2012-11-24 ENCOUNTER — Other Ambulatory Visit (INDEPENDENT_AMBULATORY_CARE_PROVIDER_SITE_OTHER): Payer: BC Managed Care – PPO

## 2012-11-24 DIAGNOSIS — E785 Hyperlipidemia, unspecified: Secondary | ICD-10-CM

## 2012-11-24 DIAGNOSIS — Z0000 Encounter for general adult medical examination without abnormal findings: Secondary | ICD-10-CM

## 2012-11-24 LAB — COMPREHENSIVE METABOLIC PANEL
ALT: 18 U/L (ref 0–35)
CO2: 31 mEq/L (ref 19–32)
Calcium: 9.5 mg/dL (ref 8.4–10.5)
Chloride: 105 mEq/L (ref 96–112)
GFR: 76.93 mL/min (ref >60.00)
Glucose, Bld: 92 mg/dL (ref 70–99)
Sodium: 141 mEq/L (ref 135–145)
Total Bilirubin: 0.7 mg/dL (ref 0.3–1.2)
Total Protein: 6.7 g/dL (ref 6.0–8.3)

## 2012-11-24 LAB — CBC WITH DIFFERENTIAL/PLATELET
Basophils Absolute: 0 10*3/uL (ref 0.0–0.1)
Eosinophils Absolute: 0.1 10*3/uL (ref 0.0–0.7)
Lymphocytes Relative: 28.3 % (ref 12.0–46.0)
MCHC: 33.8 g/dL (ref 30.0–36.0)
Monocytes Relative: 7.5 % (ref 3.0–12.0)
Neutrophils Relative %: 61.6 % (ref 43.0–77.0)
Platelets: 216 10*3/uL (ref 150.0–400.0)
RDW: 13.9 % (ref 11.5–14.6)

## 2012-11-24 LAB — LIPID PANEL
HDL: 52.8 mg/dL (ref >39.00)
Total CHOL/HDL Ratio: 4
Triglycerides: 91 mg/dL (ref 0.0–149.0)

## 2012-11-24 LAB — TSH: TSH: 1.77 u[IU]/mL (ref 0.35–5.50)

## 2012-11-27 ENCOUNTER — Ambulatory Visit (INDEPENDENT_AMBULATORY_CARE_PROVIDER_SITE_OTHER): Payer: BC Managed Care – PPO | Admitting: Physician Assistant

## 2012-11-27 ENCOUNTER — Encounter (INDEPENDENT_AMBULATORY_CARE_PROVIDER_SITE_OTHER): Payer: Self-pay

## 2012-11-27 DIAGNOSIS — Z9884 Bariatric surgery status: Secondary | ICD-10-CM

## 2012-11-27 NOTE — Patient Instructions (Signed)
Return in three months. Focus on good food choices as well as physical activity. Return sooner if you have an increase in hunger, portion sizes or weight. Return also for difficulty swallowing, night cough, reflux.   

## 2012-11-27 NOTE — Progress Notes (Signed)
  HISTORY: Elizabeth Lowery is a 57 y.o.female who received an AP-Large lap-band in December 2012 by Dr. Ezzard Standing. She has lost 5 lbs since her last visit in April. She has seen [place] [facility] in nutrition as well. She has no complaints of hunger, increasing portion sizes. She also denies persistent regurgitation, reflux or vomiting. She is going to the gym 3-4 times per week. She is not interested in a fill today.  VITAL SIGNS: Filed Vitals:   11/27/12 0956  BP: 110/72  Pulse: 53  Resp: 18    PHYSICAL EXAM: Physical exam reveals a very well-appearing 57 y.o.female in no apparent distress Neurologic: Awake, alert, oriented Psych: Bright affect, conversant Respiratory: Breathing even and unlabored. No stridor or wheezing Extremities: Atraumatic, good range of motion. Skin: Warm, Dry, no rashes Musculoskeletal: Normal gait, Joints normal  ASSESMENT: 58 y.o.  female  s/p AP-Large lap-band.   PLAN: As she is in the green zone, we deferred a fill today. We'll have her return in three months. I asked her to contact us should she notice an increase in weight, hunger or portion sizes. She voiced understanding and agreement.

## 2012-12-02 ENCOUNTER — Ambulatory Visit (INDEPENDENT_AMBULATORY_CARE_PROVIDER_SITE_OTHER): Payer: BC Managed Care – PPO | Admitting: Family Medicine

## 2012-12-02 ENCOUNTER — Encounter: Payer: Self-pay | Admitting: Family Medicine

## 2012-12-02 VITALS — BP 118/80 | HR 66 | Temp 98.1°F | Ht 64.0 in | Wt 176.5 lb

## 2012-12-02 DIAGNOSIS — F411 Generalized anxiety disorder: Secondary | ICD-10-CM

## 2012-12-02 DIAGNOSIS — E785 Hyperlipidemia, unspecified: Secondary | ICD-10-CM

## 2012-12-02 DIAGNOSIS — F418 Other specified anxiety disorders: Secondary | ICD-10-CM | POA: Insufficient documentation

## 2012-12-02 DIAGNOSIS — Z0000 Encounter for general adult medical examination without abnormal findings: Secondary | ICD-10-CM

## 2012-12-02 MED ORDER — ALPRAZOLAM 0.5 MG PO TABS: 0.2500 mg | ORAL_TABLET | Freq: Every evening | ORAL | Status: DC | PRN

## 2012-12-02 NOTE — Patient Instructions (Addendum)
Okay to use xanax in a limited fashion as needed for anxietyu/panic attacks.  Follow up in 1 month for mood... If persistent symptoms we will consider long term medicine. Schedule mammogram on your own.

## 2012-12-02 NOTE — Assessment & Plan Note (Signed)
Discussed options.  She will continue to work on stress reduction, relaxation and exercise. She will continue counseling. She can sue xanax prn... If continuing to require this at 1 month follow up we will reconsider SSRI. She refuses at this time.

## 2012-12-02 NOTE — Progress Notes (Signed)
The patient is here for annual wellness exam and preventative care.   Elevated Cholesterol: At goal <130 off simvastatin.  Lab Results  Component Value Date   CHOL 190 11/24/2012   HDL 52.80 11/24/2012   LDLCALC 119* 11/24/2012   LDLDIRECT 131.4 11/26/2011   TRIG 91.0 11/24/2012   CHOLHDL 4 11/24/2012   Using medications without problems:None.  Muscle aches: None  Other complaints:   Had lap band surgery last year 04/2011.Marland Kitchen Has lost 81 lbs.. She continues to lose weight. On high protein low carb diet.  exercising 4 times a week: walking 3 miles.  Wt Readings from Last 3 Encounters:  12/02/12 176 lb 8 oz (80.06 kg)  11/27/12 177 lb 12.8 oz (80.65 kg)  11/10/12 178 lb 11.2 oz (81.058 kg)  Feels much better overall with weight loss. Moving better.   Mother with osteoporosis.Marland Kitchenmenses at age 56. Menopause age 68. No prednisone  Bone density nml in 2009ish   She has been having some anxiety x several months, heart racing at times, difficulty sleeping.. Wakes up and cannot get back to sleep. Occ taking deep breaths to calm down. Some stressful life changes.  Denies depression.  She has used xanax and valium in past.  She is working on stress reduction.. Has joined  She is currently seeing a Veterinary surgeon.  Review of Systems  Constitutional: Negative for fever and fatigue.  HENT: Negative for ear pain.  Eyes: Negative for pain.  Respiratory: Negative for shortness of breath and wheezing.  Cardiovascular: Negative for chest pain, palpitations and leg swelling.  Gastrointestinal: Negative for diarrhea, constipation, blood in stool and abdominal distention.  Genitourinary: Negative for dysuria, vaginal bleeding, vaginal discharge, vaginal pain and pelvic pain.  Skin: Negative for rash.  Psychiatric/Behavioral: Negative for dysphoric mood and  But some agitation.  Objective:   Physical Exam  Constitutional: Vital signs are normal. She appears well-developed and well-nourished. She is cooperative.  Non-toxic appearance. She does not appear ill. No distress.  HENT:  Head: Normocephalic.  Right Ear: Hearing, tympanic membrane, external ear and ear canal normal.  Left Ear: Hearing, tympanic membrane, external ear and ear canal normal.  Nose: Nose normal.  Eyes: Conjunctivae, EOM and lids are normal. Pupils are equal, round, and reactive to light. No foreign bodies found.  Neck: Trachea normal and normal range of motion. Neck supple. Carotid bruit is not present. No mass and no thyromegaly present.  Cardiovascular: Normal rate, regular rhythm, S1 normal, S2 normal, normal heart sounds and intact distal pulses. Exam reveals no gallop.  No murmur heard.  Pulmonary/Chest: Effort normal and breath sounds normal. No respiratory distress. She has no wheezes. She has no rhonchi. She has no rales.  Abdominal: Soft. Normal appearance and bowel sounds are normal. She exhibits no distension, no fluid wave, no abdominal bruit and no mass. There is no hepatosplenomegaly. There is no tenderness. There is no rebound, no guarding and no CVA tenderness. No hernia.  Genitourinary: Vagina normal and uterus normal. No breast swelling, tenderness, discharge or bleeding. Pelvic exam was performed with patient prone. There is no rash, tenderness or lesion on the right labia. There is no rash, tenderness or lesion on the left labia. Uterus is not enlarged and not tender. Right adnexum displays no mass, no tenderness and no fullness. Left adnexum displays no mass, no tenderness and no fullness.  Lymphadenopathy:  She has no cervical adenopathy.  She has no axillary adenopathy.  Neurological: She is alert. She has normal strength. No  cranial nerve deficit or sensory deficit.  Skin: Skin is warm, dry and intact. No rash noted.  Psychiatric: Her speech is normal and behavior is normal. Judgment normal. Her mood appears not anxious. Cognition and memory are normal. She does not exhibit a depressed mood.  Assessment & Plan:    Complete Physical Exam: The patient's preventative maintenance and recommended screening tests for an annual wellness exam were reviewed in full today.  Brought up to date unless services declined.  Counselled on the importance of diet, exercise, and its role in overall health and mortality.  The patient's FH and SH was reviewed, including their home life, tobacco status, and drug and alcohol status.   PAP, every 3 years following given normals >3 in a row. Last done 2012 DVE exam done yearly.  Mammo due after 8/13 Colon, last nml in 2006: due In 2016.  Vaccines: Td UptoDate. DEXA: nml 12/2011 repeat in 2-5 years

## 2012-12-02 NOTE — Assessment & Plan Note (Signed)
Encouraged exercise, weight loss, healthy eating habits.  Well controlled on no med.

## 2012-12-12 ENCOUNTER — Encounter: Payer: Self-pay | Admitting: Family Medicine

## 2013-01-02 ENCOUNTER — Ambulatory Visit: Payer: BC Managed Care – PPO | Admitting: Family Medicine

## 2013-01-06 ENCOUNTER — Ambulatory Visit: Payer: BC Managed Care – PPO | Admitting: Family Medicine

## 2013-01-13 ENCOUNTER — Ambulatory Visit (INDEPENDENT_AMBULATORY_CARE_PROVIDER_SITE_OTHER): Payer: BC Managed Care – PPO | Admitting: Family Medicine

## 2013-01-13 ENCOUNTER — Encounter: Payer: Self-pay | Admitting: Family Medicine

## 2013-01-13 VITALS — BP 106/74 | HR 66 | Temp 98.0°F | Wt 174.5 lb

## 2013-01-13 DIAGNOSIS — F418 Other specified anxiety disorders: Secondary | ICD-10-CM

## 2013-01-13 DIAGNOSIS — F411 Generalized anxiety disorder: Secondary | ICD-10-CM

## 2013-01-13 NOTE — Progress Notes (Signed)
  Subjective:    Patient ID: Elizabeth Lowery, female    DOB: [birth year], 57 y.o.   MRN: [id number]  HPI  57 yearold female here for 1 onth follow up of situational anxiety.  At last OV she noted: She has been having some anxiety x several months, heart racing at times, difficulty sleeping.. Wakes up and cannot get back to sleep.  Occ taking deep breaths to calm down.  Some stressful life changes.  Denies depression.  She has used xanax and valium in past.  In the last month she states she is feeling better about 30 % better. She is starting a new job (to stay active), now going to the gym. Seeing counselor every two weeks. Has used 1/2 tab at night to help with sleep. She has not used every night. No SI.  She has started [employer] but she is not interested in SSRI at this point.       Review of Systems  Constitutional: Negative for fever and fatigue.  HENT: Negative for ear pain.   Eyes: Negative for pain.  Respiratory: Negative for chest tightness and shortness of breath.   Cardiovascular: Negative for chest pain, palpitations and leg swelling.  Gastrointestinal: Negative for abdominal pain.  Genitourinary: Negative for dysuria.       Objective:   Physical Exam  Constitutional: Vital signs are normal. She appears well-developed and well-nourished. She is cooperative.  Non-toxic appearance. She does not appear ill. No distress.  HENT:  Head: Normocephalic.  Right Ear: Hearing, tympanic membrane, external ear and ear canal normal. Tympanic membrane is not erythematous, not retracted and not bulging.  Left Ear: Hearing, tympanic membrane, external ear and ear canal normal. Tympanic membrane is not erythematous, not retracted and not bulging.  Nose: No mucosal edema or rhinorrhea. Right sinus exhibits no maxillary sinus tenderness and no frontal sinus tenderness. Left sinus exhibits no maxillary sinus tenderness and no frontal sinus tenderness.  Mouth/Throat: Uvula is  midline, oropharynx is clear and moist and mucous membranes are normal.  Eyes: Conjunctivae, EOM and lids are normal. Pupils are equal, round, and reactive to light. Lids are everted and swept, no foreign bodies found.  Neck: Trachea normal and normal range of motion. Neck supple. Carotid bruit is not present. No mass and no thyromegaly present.  Cardiovascular: Normal rate, regular rhythm, S1 normal, S2 normal, normal heart sounds, intact distal pulses and normal pulses.  Exam reveals no gallop and no friction rub.   No murmur heard. Pulmonary/Chest: Effort normal and breath sounds normal. Not tachypneic. No respiratory distress. She has no decreased breath sounds. She has no wheezes. She has no rhonchi. She has no rales.  Abdominal: Soft. Normal appearance and bowel sounds are normal. There is no tenderness.  Neurological: She is alert.  Skin: Skin is warm, dry and intact. No rash noted.  Psychiatric: Her speech is normal and behavior is normal. Judgment and thought content normal. Her mood appears not anxious. Cognition and memory are normal. She does not exhibit a depressed mood.          Assessment & Plan:

## 2013-01-13 NOTE — Patient Instructions (Addendum)
Call if mood not continuing to improve. Use alprazolam on a limited basis.

## 2013-01-13 NOTE — Assessment & Plan Note (Addendum)
Improving control with exercsie, counsleing and new activity.  Using alprazolam in limited fashoin.  Follow up will be needed if alprazolam used for longer than several months or increasing use.

## 2013-01-16 ENCOUNTER — Encounter: Payer: Self-pay | Admitting: Family Medicine

## 2013-02-03 ENCOUNTER — Other Ambulatory Visit: Payer: Self-pay | Admitting: Family Medicine

## 2013-02-04 NOTE — Telephone Encounter (Signed)
Last office visit 01/13/2013

## 2013-02-05 NOTE — Telephone Encounter (Signed)
Pt left v/m checking on status of alprazolam refill to [facility]; pt is out of med. Please advise.

## 2013-02-05 NOTE — Telephone Encounter (Signed)
Ok to refill #30, 0 refills  Remind her to call at least a week prior to running out

## 2013-02-05 NOTE — Telephone Encounter (Signed)
Called to [facility].  Zalika notified as instructed by telephone.

## 2013-02-26 ENCOUNTER — Encounter (INDEPENDENT_AMBULATORY_CARE_PROVIDER_SITE_OTHER): Payer: BC Managed Care – PPO

## 2013-03-04 ENCOUNTER — Ambulatory Visit (INDEPENDENT_AMBULATORY_CARE_PROVIDER_SITE_OTHER): Payer: BC Managed Care – PPO

## 2013-03-04 DIAGNOSIS — Z23 Encounter for immunization: Secondary | ICD-10-CM

## 2013-03-18 ENCOUNTER — Encounter: Payer: Self-pay | Admitting: Family Medicine

## 2013-03-18 ENCOUNTER — Ambulatory Visit (INDEPENDENT_AMBULATORY_CARE_PROVIDER_SITE_OTHER): Payer: BC Managed Care – PPO | Admitting: Family Medicine

## 2013-03-18 VITALS — BP 118/70 | HR 64 | Temp 98.3°F | Ht 64.0 in | Wt 173.5 lb

## 2013-03-18 DIAGNOSIS — J209 Acute bronchitis, unspecified: Secondary | ICD-10-CM

## 2013-03-18 DIAGNOSIS — J208 Acute bronchitis due to other specified organisms: Secondary | ICD-10-CM

## 2013-03-18 MED ORDER — HYDROCODONE-HOMATROPINE 5-1.5 MG/5ML PO SYRP: ORAL_SOLUTION | ORAL | Status: DC

## 2013-03-18 NOTE — Progress Notes (Signed)
   Date:  03/18/2013   Name:  Elizabeth Lowery   DOB:  1956-01-24   MRN:  [id number] Gender: female Age: 57 y.o.  Primary Physician:  Kerby Nora, MD   Chief Complaint: Cough, Sore Throat and Nasal Congestion   History of Present Illness:  Elizabeth Lowery is a 57 y.o. very pleasant female patient who presents with the following:  Has had a lot of head congestion, drainage, sore throat and has been pretty achy. Has been taking some over the counter medication. Sore throat with night.  No smoker.   Past Medical History, Surgical History, Social History, Family History, Problem List, Medications, and Allergies have been reviewed and updated if relevant.   Review of Systems: ROS: GEN: Acute illness details above GI: Tolerating PO intake GU: maintaining adequate hydration and urination Pulm: No SOB Interactive and getting along well at home.  Otherwise, ROS is as per the HPI.   Physical Examination: BP 118/70  Pulse 64  Temp(Src) 98.3 F (36.8 C) (Oral)  Ht 5\' 4"  (1.626 m)  Wt 173 lb 8 oz (78.699 kg)  BMI 29.77 kg/m2   GEN: A and O x 3. WDWN. NAD.    ENT: Nose clear, ext NML.  No LAD.  No JVD.  TM's clear. Oropharynx clear.  PULM: Normal WOB, no distress. No crackles, wheezes, rhonchi. CV: RRR, no M/G/R, No rubs, No JVD.   EXT: warm and well-perfused, No c/c/e. PSYCH: Pleasant and conversant.  Assessment and Plan:  Viral bronchitis  Supportive care.  Orders Today:  No orders of the defined types were placed in this encounter.    Updated Medication List: (Includes new medications, updates to list, dose adjustments) Meds ordered this encounter  Medications  . HYDROcodone-homatropine (HYCODAN) 5-1.5 MG/5ML syrup    Sig: 1 tsp po at night before bed prn cough    Dispense:  240 mL    Refill:  0    Medications Discontinued: There are no discontinued medications.    Signed,  Elpidio Galea. Virdia Ziesmer, MD, CAQ Sports Medicine  [facility] [address] [place] [address] Phone: [phone] Fax: [phone]

## 2013-03-19 ENCOUNTER — Ambulatory Visit (INDEPENDENT_AMBULATORY_CARE_PROVIDER_SITE_OTHER): Payer: BC Managed Care – PPO | Admitting: Physician Assistant

## 2013-03-19 ENCOUNTER — Encounter (INDEPENDENT_AMBULATORY_CARE_PROVIDER_SITE_OTHER): Payer: Self-pay

## 2013-03-19 VITALS — BP 136/70 | HR 76 | Resp 16 | Ht 64.0 in | Wt 173.6 lb

## 2013-03-19 DIAGNOSIS — Z4651 Encounter for fitting and adjustment of gastric lap band: Secondary | ICD-10-CM

## 2013-03-19 NOTE — Progress Notes (Signed)
  HISTORY: Elizabeth Lowery is a 57 y.o.female who received an AP-Large lap-band in December 2012 by Dr. Ezzard Standing. She comes in with 4 pounds of weight loss since her last visit in July when we deferred a fill as she was in the green zone. She has noticed since then a slight increase in her portion sizes and hunger. She's had no issues with persistent regurgitation or reflux. She recently suffered a URI which has improved dramatically.  VITAL SIGNS: Filed Vitals:   03/19/13 1423  BP: 136/70  Pulse: 76  Resp: 16    PHYSICAL EXAM: Physical exam reveals a very well-appearing 57 y.o.female in no apparent distress Neurologic: Awake, alert, oriented Psych: Bright affect, conversant Respiratory: Breathing even and unlabored. No stridor or wheezing Abdomen: Soft, nontender, nondistended to palpation. Incisions well-healed. No incisional hernias. Port easily palpated. Extremities: Atraumatic, good range of motion.  ASSESMENT: 57 y.o.  female  s/p AP-Large lap-band.   PLAN: The patient's port was accessed with a 20G Huber needle without difficulty. Clear fluid was aspirated and 0.25 mL saline was added to the port to give a total predicted volume of 4.5 mL. The patient was able to swallow water without difficulty following the procedure and was instructed to take clear liquids for the next 24-48 hours and advance slowly as tolerated. I congratulated her on her work so far and getting her BMI below 30. We'll have her back in three months or sooner if needed.

## 2013-03-19 NOTE — Patient Instructions (Signed)

## 2013-03-26 ENCOUNTER — Telehealth (INDEPENDENT_AMBULATORY_CARE_PROVIDER_SITE_OTHER): Payer: Self-pay

## 2013-03-26 NOTE — Telephone Encounter (Signed)
Called patient and left a message letting her know that she would havwe to reschedule her appointment for Jan. 29th as Mardelle Matte will not be in the office.

## 2013-06-11 ENCOUNTER — Ambulatory Visit (INDEPENDENT_AMBULATORY_CARE_PROVIDER_SITE_OTHER): Payer: BC Managed Care – PPO | Admitting: Physician Assistant

## 2013-06-11 ENCOUNTER — Encounter (INDEPENDENT_AMBULATORY_CARE_PROVIDER_SITE_OTHER): Payer: Self-pay

## 2013-06-11 VITALS — BP 110/70 | HR 60 | Resp 14 | Ht 64.0 in | Wt 174.0 lb

## 2013-06-11 DIAGNOSIS — Z4651 Encounter for fitting and adjustment of gastric lap band: Secondary | ICD-10-CM

## 2013-06-11 NOTE — Progress Notes (Signed)
  HISTORY: Elizabeth Lowery is a 58 y.o.female who received an AP-Large lap-band in December 2012 by Dr. Lucia Gaskins. She comes in with stable weight since her last visit. She reports not eating that well over the holidays which has stymied her weight loss. She also notices an increase in her portion sizes which she knows could be troublesome. She has no regurgitation or reflux symptoms.  VITAL SIGNS: Filed Vitals:   06/11/13 1153  BP: 110/70  Pulse: 60  Resp: 14    PHYSICAL EXAM: Physical exam reveals a very well-appearing 58 y.o.female in no apparent distress Neurologic: Awake, alert, oriented Psych: Bright affect, conversant Respiratory: Breathing even and unlabored. No stridor or wheezing Abdomen: Soft, nontender, nondistended to palpation. Incisions well-healed. No incisional hernias. Port easily palpated. Extremities: Atraumatic, good range of motion.  ASSESMENT: 58 y.o.  female  s/p AP-Large lap-band.   PLAN: The patient's port was accessed with a 20G Huber needle without difficulty. Clear fluid was aspirated and 0.5 mL saline was added to the port to give a total predicted volume of 5 mL. The patient was able to swallow water without difficulty following the procedure and was instructed to take clear liquids for the next 24-48 hours and advance slowly as tolerated.

## 2013-06-11 NOTE — Patient Instructions (Signed)

## 2013-06-18 ENCOUNTER — Encounter (INDEPENDENT_AMBULATORY_CARE_PROVIDER_SITE_OTHER): Payer: BC Managed Care – PPO

## 2013-08-06 ENCOUNTER — Encounter (INDEPENDENT_AMBULATORY_CARE_PROVIDER_SITE_OTHER): Payer: BC Managed Care – PPO

## 2013-08-20 ENCOUNTER — Encounter (INDEPENDENT_AMBULATORY_CARE_PROVIDER_SITE_OTHER): Payer: BC Managed Care – PPO

## 2013-08-27 ENCOUNTER — Encounter (INDEPENDENT_AMBULATORY_CARE_PROVIDER_SITE_OTHER): Payer: BC Managed Care – PPO

## 2013-08-27 ENCOUNTER — Ambulatory Visit (INDEPENDENT_AMBULATORY_CARE_PROVIDER_SITE_OTHER): Payer: BC Managed Care – PPO | Admitting: Physician Assistant

## 2013-08-27 ENCOUNTER — Encounter (INDEPENDENT_AMBULATORY_CARE_PROVIDER_SITE_OTHER): Payer: Self-pay

## 2013-08-27 VITALS — BP 104/70 | HR 80 | Temp 97.6°F | Resp 16 | Ht 64.0 in | Wt 171.6 lb

## 2013-08-27 DIAGNOSIS — Z9884 Bariatric surgery status: Secondary | ICD-10-CM

## 2013-08-27 NOTE — Patient Instructions (Signed)
Return in six months. Focus on good food choices as well as physical activity. Return sooner if you have an increase in hunger, portion sizes or weight. Return also for difficulty swallowing, night cough, reflux.   

## 2013-08-27 NOTE — Progress Notes (Signed)
  HISTORY: Elizabeth Lowery is a 58 y.o.female who received an AP-Large lap-band in December 2012 by Dr. Lucia Gaskins. She comes in today with 2.4 lbs weight loss since her last visit three months ago. She is very pleased with where she is both in terms of her weight loss and her level of restriction. She reports being squarely in the green zone after her last fill in January. Her portion sizes are remaining small and her hunger is well-controlled. She is concentrating on lean proteins as well as healthy vegetables and fruits. She occasionally has a protein shake. She is exercising regularly using resistance training and light cardiovascular activity.  VITAL SIGNS: Filed Vitals:   08/27/13 0842  BP: 104/70  Pulse: 80  Temp: 97.6 F (36.4 C)  Resp: 16    PHYSICAL EXAM: Physical exam reveals a very well-appearing 58 y.o.female in no apparent distress Neurologic: Awake, alert, oriented Psych: Bright affect, conversant Respiratory: Breathing even and unlabored. No stridor or wheezing Extremities: Atraumatic, good range of motion. Skin: Warm, Dry, no rashes Musculoskeletal: Normal gait, Joints normal  ASSESMENT: 58 y.o.  female  s/p AP-Large lap-band.   PLAN: As her hunger and portion sizes are under good control, we opted to defer a fill today. She will continue her current regimen and return to see [place] in six months. She will call to be seen sooner if she begins having issues with weight gain or increased intake. Her total weight loss since surgery is 85 lbs, and is below her initial target weight of 180 lbs.

## 2013-09-17 ENCOUNTER — Encounter: Payer: Self-pay | Admitting: Family Medicine

## 2013-09-17 ENCOUNTER — Ambulatory Visit (INDEPENDENT_AMBULATORY_CARE_PROVIDER_SITE_OTHER): Payer: BC Managed Care – PPO | Admitting: Family Medicine

## 2013-09-17 VITALS — BP 110/80 | HR 52 | Temp 98.0°F | Ht 64.0 in | Wt 172.5 lb

## 2013-09-17 DIAGNOSIS — R300 Dysuria: Secondary | ICD-10-CM

## 2013-09-17 DIAGNOSIS — N390 Urinary tract infection, site not specified: Secondary | ICD-10-CM

## 2013-09-17 LAB — POCT URINALYSIS DIPSTICK
Bilirubin, UA: NEGATIVE
Glucose, UA: NEGATIVE
KETONES UA: NEGATIVE
Nitrite, UA: NEGATIVE
PH UA: 6
SPEC GRAV UA: 1.015
Urobilinogen, UA: 0.2

## 2013-09-17 MED ORDER — SULFAMETHOXAZOLE-TRIMETHOPRIM 400-80 MG PO TABS: 1.0000 | ORAL_TABLET | Freq: Two times a day (BID) | ORAL | Status: DC

## 2013-09-17 NOTE — Progress Notes (Signed)
Pre visit review using our clinic review tool, if applicable. No additional management support is needed unless otherwise documented below in the visit note. 

## 2013-09-17 NOTE — Progress Notes (Addendum)
[place] [address] Phone: [phone] Fax: [phone]  Patient ID: Elizabeth Lowery MRN: [id number], DOB: [birth year], 58 y.o. Date of Encounter: 09/17/2013  Primary Physician:  Eliezer Lofts, MD   Chief Complaint: Burning with urination   Subjective:   History of Present Illness:  This 58 y.o. female patient presents with burning, urgency. Dysuria x 2 days Took some cranberry azo.  No vaginal discharge or external irritation.  No STD exposure. No abd pain, no flank pain.  Patient Active Problem List   Diagnosis Date Noted  . Situational anxiety 12/02/2012  . Coccygeal pain, acute 03/04/2012  . History of laparoscopic adjustable gastric banding, 04/24/2011. 05/10/2011  . Morbid obesity, initial weight 257, BMI 44.1 04/11/2011  . Fatty liver 12/01/2010  . HYPERLIPIDEMIA 08/06/2008    Past Medical History  Diagnosis Date  . Hyperlipidemia   . RUQ pain 2012    ruq pain with fatty lover of u/s, no gallstones and normal HIDA scan  . PONV (postoperative nausea and vomiting)   . Seasonal allergies   . Arthritis     osteoarthritis  . Sinus problem   . Menopause   . Morbid obesity     Past Surgical History  Procedure Laterality Date  . Total hip arthroplasty  2010, 2012    Bilateral  . Joint replacement  2010, 2011    right on 12/2008, left on 07/2009  . Tubal ligation    . Laparoscopic gastric banding  04/24/2011    Procedure: LAPAROSCOPIC GASTRIC BANDING;  Surgeon: Shann Medal, MD;  Location: [facility];  Service: General;  Laterality: N/A;  with Mesh under port    History   Social History  . Marital Status: Divorced    Spouse Name: N/A    Number of Children: 1  . Years of Education: N/A   Occupational History  . Retired from state   . rental properties     part time   Social History Main Topics  . Smoking status: Never Smoker   . Smokeless tobacco: Never Used  . Alcohol Use: 0.6 oz/week    1 Glasses of wine per week     Comment: 1-2  wine on weekends  . Drug Use: No  . Sexual Activity: Not on file   Other Topics Concern  . Not on file   Social History Narrative   Regular exercise: yes, but more limited due to heel pain   Diet:ruits and veggies    Family History  Problem Relation Age of Onset  . Lymphoma Father   . Lung cancer Father   . Cancer Father 29    lymphoma, lung  . Osteoarthritis Father   . Hyperlipidemia Mother   . Osteoporosis Mother   . Cancer Mother   . Other Mother     spinal stenosis  . Diabetes Brother   . Other Brother     morbid obesity, lung issues  . Osteoarthritis Brother   . Sleep apnea Brother   . Colon cancer Maternal Grandmother   . Cancer Maternal Grandmother     colon  . Coronary artery disease Paternal Grandmother   . Osteoarthritis Sister   . Hyperlipidemia Sister   . Hypertension Sister   . Other Sister     morbid obesity    No Known Allergies  Medication list reviewed and updated in full in [place].  ROS: GEN:  no fevers, chills. GI: No n/v/d, eating normally Otherwise, ROS is as per  the HPI.  Objective:   PHYSICAL EXAM  Filed Vitals:   09/17/13 1220  BP: 110/80  Pulse: 52  Temp: 98 F (36.7 C)  TempSrc: Oral  Height: 5\' 4"  (1.626 m)  Weight: 172 lb 8 oz (78.245 kg)    GEN: WDWN, A&Ox4,NAD. Non-toxic HEENT: Atraumatc, normocephalic. CV: RRR, No M/G/R PULM: CTA B, No wheezes, crackles, or rhonchi ABD: S, NT, ND, +BS, no rebound. No CVAT. No suprapubic tenderness. EXT: No c/c/e  Objective Data: Results for orders placed in visit on 09/17/13  POCT URINALYSIS DIPSTICK      Result Value Ref Range   Color, UA yellow     Clarity, UA clear     Glucose, UA negative     Bilirubin, UA negative     Ketones, UA negative     Spec Grav, UA 1.015     Blood, UA hemolyzed trace     pH, UA 6.0     Protein, UA trace     Urobilinogen, UA 0.2     Nitrite, UA negative     Leukocytes, UA Trace      Assessment & Plan:   UTI. Rx with ABX as  below  Follow-up: No Follow-up on file. Unless noted above, the patient is to follow-up if symptoms worsen. Red flags were reviewed with the patient. Otherwise, they should follow-up for routine medical care.   New Prescriptions   SULFAMETHOXAZOLE-TRIMETHOPRIM (BACTRIM,SEPTRA) 400-80 MG PER TABLET    Take 1 tablet by mouth 2 (two) times daily.    Orders Placed This Encounter  Procedures  . Urine culture  . POCT Urinalysis Dipstick   Addendum, 09/20/2013  Culture  ESCHERICHIA COLI   Colony Count  50,000 COLONIES/ML   Organism ID, Bacteria  ESCHERICHIA COLI   Comments: Confirmed Extended Spectrum Beta-Lactamase Producer (ESBL)  ESCHERICHIA COLI AMOX/CLAVULANIC 4 S Final AMPICILLIN >=32 R Final AMPICILLIN/SULBACTAM 8 S Final CEFAZOLIN >=64 R Final CEFEPIME R CEFTAZIDIME R CEFTRIAXONE R CIPROFLOXACIN >=4 R Final GENTAMICIN <=1 S Final IMIPENEM <=0.25 S Final LEVOFLOXACIN >=8 R Final NITROFURANTOIN <=16 S Final PIP/TAZO <=4 S Final TOBRAMYCIN <=1 S Final TRIMETH/SULFA >=320 R Final  Highly drug resistant UTI. Few outpatient options.  Augmentin 875 mg bid x 7 days Spoke to patient. Electronically Signed  By: Owens Loffler, MD On: 09/20/2013 9:45 AM   Signed,  Maud Deed. Juanito Gonyer, MD, [place]   Patient's Medications  New Prescriptions   SULFAMETHOXAZOLE-TRIMETHOPRIM (BACTRIM,SEPTRA) 400-80 MG PER TABLET    Take 1 tablet by mouth 2 (two) times daily.  Previous Medications   ACETAMINOPHEN (TYLENOL) 500 MG TABLET    Take 500 mg by mouth every 6 (six) hours as needed.   BIOTIN PO    Take 1,000 mcg by mouth daily.   CALCIUM CARBONATE (CALCIUM 500 PO)    Take 3 tablets by mouth daily.   CHOLECALCIFEROL (VITAMIN D) 1000 UNITS TABLET    Take 2,000 Units by mouth daily.    MELATONIN 3 MG TABS    Take 2 tablets by mouth at bedtime as needed.   MULTIPLE VITAMIN (MULTIVITAMIN) TABLET    Take 1 tablet by mouth daily.   Modified Medications   No medications on file  Discontinued  Medications   ALPRAZOLAM (XANAX) 0.5 MG TABLET    TAKE 1/2-1 TABLET BY MOUTH AT BEDTIME AS NEEDED FOR SLEEP

## 2013-09-19 LAB — URINE CULTURE: Colony Count: 50000

## 2013-09-20 MED ORDER — AMOXICILLIN-POT CLAVULANATE 875-125 MG PO TABS
1.0000 | ORAL_TABLET | Freq: Two times a day (BID) | ORAL | Status: DC
Start: 2013-09-20 — End: 2014-01-19

## 2013-09-20 NOTE — Addendum Note (Signed)
Addended by: Owens Loffler on: 09/20/2013 09:46 AM   Modules accepted: Orders, Medications

## 2014-01-12 ENCOUNTER — Telehealth: Payer: Self-pay | Admitting: Family Medicine

## 2014-01-12 ENCOUNTER — Other Ambulatory Visit (INDEPENDENT_AMBULATORY_CARE_PROVIDER_SITE_OTHER): Payer: BC Managed Care – PPO

## 2014-01-12 DIAGNOSIS — E785 Hyperlipidemia, unspecified: Secondary | ICD-10-CM

## 2014-01-12 LAB — COMPREHENSIVE METABOLIC PANEL
ALBUMIN: 3.8 g/dL (ref 3.5–5.2)
ALK PHOS: 46 U/L (ref 39–117)
ALT: 19 U/L (ref 0–35)
AST: 22 U/L (ref 0–37)
BUN: 15 mg/dL (ref 6–23)
CO2: 28 meq/L (ref 19–32)
Calcium: 9.1 mg/dL (ref 8.4–10.5)
Chloride: 107 mEq/L (ref 96–112)
Creatinine, Ser: 0.8 mg/dL (ref 0.4–1.2)
GFR: 75.56 mL/min (ref >60.00)
GLUCOSE: 85 mg/dL (ref 70–99)
POTASSIUM: 4 meq/L (ref 3.5–5.1)
SODIUM: 140 meq/L (ref 135–145)
TOTAL PROTEIN: 6.7 g/dL (ref 6.0–8.3)
Total Bilirubin: 0.8 mg/dL (ref 0.2–1.2)

## 2014-01-12 LAB — LIPID PANEL
CHOLESTEROL: 168 mg/dL (ref 0–200)
HDL: 56.5 mg/dL (ref >39.00)
LDL CALC: 100 mg/dL — AB (ref 0–99)
NONHDL: 111.5
Total CHOL/HDL Ratio: 3
Triglycerides: 60 mg/dL (ref 0.0–149.0)
VLDL: 12 mg/dL (ref 0.0–40.0)

## 2014-01-12 NOTE — Telephone Encounter (Signed)
Message copied by Jinny Sanders on Tue Jan 12, 2014 10:45 AM ------      Message from: Ellamae Sia      Created: Tue Jan 05, 2014  3:44 PM      Regarding: Lab orders for Tuesday, 8.25.15       Patient is scheduled for CPX labs, please order future labs, Thanks , Terri       ------

## 2014-01-19 ENCOUNTER — Encounter: Payer: Self-pay | Admitting: Family Medicine

## 2014-01-19 ENCOUNTER — Ambulatory Visit (INDEPENDENT_AMBULATORY_CARE_PROVIDER_SITE_OTHER): Payer: BC Managed Care – PPO | Admitting: Family Medicine

## 2014-01-19 ENCOUNTER — Other Ambulatory Visit (HOSPITAL_COMMUNITY)
Admission: RE | Admit: 2014-01-19 | Discharge: 2014-01-19 | Disposition: A | Discharge status: Home / Self Care | Payer: BC Managed Care – PPO | Source: Ambulatory Visit | Attending: Family Medicine | Admitting: Family Medicine

## 2014-01-19 VITALS — BP 112/66 | HR 62 | Temp 98.2°F | Ht 63.25 in | Wt 174.0 lb

## 2014-01-19 DIAGNOSIS — Z124 Encounter for screening for malignant neoplasm of cervix: Secondary | ICD-10-CM | POA: Diagnosis not present

## 2014-01-19 DIAGNOSIS — Z1151 Encounter for screening for human papillomavirus (HPV): Secondary | ICD-10-CM | POA: Insufficient documentation

## 2014-01-19 DIAGNOSIS — E785 Hyperlipidemia, unspecified: Secondary | ICD-10-CM

## 2014-01-19 DIAGNOSIS — Z23 Encounter for immunization: Secondary | ICD-10-CM

## 2014-01-19 DIAGNOSIS — Z0000 Encounter for general adult medical examination without abnormal findings: Secondary | ICD-10-CM

## 2014-01-19 DIAGNOSIS — Z2911 Encounter for prophylactic immunotherapy for respiratory syncytial virus (RSV): Secondary | ICD-10-CM

## 2014-01-19 NOTE — Progress Notes (Signed)
The patient is here for annual wellness exam and preventative care.    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 Lucia Gaskins).  Wt Readings from Last 3 Encounters:  01/19/14 174 lb (78.926 kg)  09/17/13 172 lb 8 oz (78.245 kg)  08/27/13 171 lb 9.6 oz (77.837 kg)    Elevated Cholesterol: At goal <130 off simvastatin.  Lab Results  Component Value Date   CHOL 168 01/12/2014   HDL 56.50 01/12/2014   LDLCALC 100* 01/12/2014   LDLDIRECT 131.4 11/26/2011   TRIG 60.0 01/12/2014   CHOLHDL 3 01/12/2014  Using medications without problems:None.  Muscle aches: None  Other complaints:  Weight is stable.. On high protein low carb diet.  Exercising 3-4 times a week: walking 3 miles.   Feels much better overall with weight loss. Moving better.   Review of Systems  Constitutional: Negative for fever and fatigue.  HENT: Negative for ear pain.  Eyes: Negative for pain.  Respiratory: Negative for shortness of breath and wheezing.  Cardiovascular: Negative for chest pain, palpitations and leg swelling.  Gastrointestinal: Negative for diarrhea, constipation, blood in stool and abdominal distention.  Genitourinary: Negative for dysuria, vaginal bleeding, vaginal discharge, vaginal pain and pelvic pain.  Skin: Negative for rash.  Psychiatric/Behavioral: Negative for dysphoric mood  No anxiety. Objective:   Physical Exam  Constitutional: Vital signs are normal. She appears well-developed and well-nourished. She is cooperative. Non-toxic appearance. She does not appear ill. No distress.  HENT:  Head: Normocephalic.  Right Ear: Hearing, tympanic membrane, external ear and ear canal normal.  Left Ear: Hearing, tympanic membrane, external ear and ear canal normal.  Nose: Nose normal.  Eyes: Conjunctivae, EOM and lids are normal. Pupils are equal, round, and reactive to light. No foreign bodies found.  Neck: Trachea normal and normal range of motion. Neck supple. Carotid bruit is  not present. No mass and no thyromegaly present.  Cardiovascular: Normal rate, regular rhythm, S1 normal, S2 normal, normal heart sounds and intact distal pulses. Exam reveals no gallop.  No murmur heard.  Pulmonary/Chest: Effort normal and breath sounds normal. No respiratory distress. She has no wheezes. She has no rhonchi. She has no rales.  Abdominal: Soft. Normal appearance and bowel sounds are normal. She exhibits no distension, no fluid wave, no abdominal bruit and no mass. There is no hepatosplenomegaly. There is no tenderness. There is no rebound, no guarding and no CVA tenderness. No hernia.  Genitourinary: Vagina normal and uterus normal. No breast swelling, tenderness, discharge or bleeding. Pelvic exam was performed with patient supine. There is no rash, tenderness or lesion on the right labia. There is no rash, tenderness or lesion on the left labia. Uterus is not enlarged and not tender. Right adnexum displays no mass, no tenderness and no fullness. Left adnexum displays no mass, no tenderness and no fullness.  PAP PERFORMED. Lymphadenopathy:  She has no cervical adenopathy.  She has no axillary adenopathy.  Neurological: She is alert. She has normal strength. No cranial nerve deficit or sensory deficit.  Skin: Skin is warm, dry and intact. No rash noted.  Psychiatric: Her speech is normal and behavior is normal. Judgment normal. Her mood appears not anxious. Cognition and memory are normal. She does not exhibit a depressed mood.  Assessment & Plan:   Complete Physical Exam: The patient's preventative maintenance and recommended screening tests for an annual wellness exam were reviewed in full today.  Brought up to date unless services declined.  Counselled on the importance of diet, exercise, and its role in overall health and mortality.  The patient's FH and SH was reviewed, including their home life, tobacco status, and drug and alcohol status.   PAP, every 3 years, Last done  2012, DVE exam done yearly.  PAP due this year. Mammo due  Colon, last nml in 2006: due In 2016.  Vaccines: Td UptoDate, due for shingles and flu.. Given today. DEXA: nml 12/2011 repeat in 5 years

## 2014-01-19 NOTE — Assessment & Plan Note (Signed)
Well controlled on no med. Encouraged exercise, weight loss, healthy eating habits.   

## 2014-01-19 NOTE — Patient Instructions (Addendum)
Schedule mammogram on your own. Work on [employer] and regular exercise.

## 2014-01-19 NOTE — Progress Notes (Signed)
Pre visit review using our clinic review tool, if applicable. No additional management support is needed unless otherwise documented below in the visit note. 

## 2014-01-21 LAB — CYTOLOGY - PAP

## 2014-02-25 ENCOUNTER — Encounter (INDEPENDENT_AMBULATORY_CARE_PROVIDER_SITE_OTHER): Payer: BC Managed Care – PPO

## 2014-03-29 ENCOUNTER — Encounter: Payer: Self-pay | Admitting: Family Medicine

## 2014-05-13 ENCOUNTER — Ambulatory Visit: Payer: BC Managed Care – PPO | Admitting: Family Medicine

## 2014-05-13 ENCOUNTER — Encounter: Payer: Self-pay | Admitting: Internal Medicine

## 2014-05-13 ENCOUNTER — Ambulatory Visit (INDEPENDENT_AMBULATORY_CARE_PROVIDER_SITE_OTHER): Payer: BC Managed Care – PPO | Admitting: Internal Medicine

## 2014-05-13 VITALS — BP 122/74 | HR 62 | Temp 98.4°F | Wt 175.0 lb

## 2014-05-13 DIAGNOSIS — J3489 Other specified disorders of nose and nasal sinuses: Secondary | ICD-10-CM

## 2014-05-13 MED ORDER — MUPIROCIN 2 % EX OINT: 1.0000 "application " | TOPICAL_OINTMENT | Freq: Two times a day (BID) | CUTANEOUS | Status: DC

## 2014-05-13 NOTE — Progress Notes (Signed)
Subjective:    Patient ID: Elizabeth Lowery, female    DOB: May 03, 1956, 58 y.o.   MRN: [id number]  HPI  Pt presents to the clinic today with c/o sore in her nose. She noticed this 2-3 weeks ago. She denies headache, ear pain, runny nose, nasal congestion, sore throat or cough. She has not tried anything OTC. She did have a viral illness 1 month ago.  Review of Systems      Past Medical History  Diagnosis Date  . Hyperlipidemia   . RUQ pain 2012    ruq pain with fatty lover of u/s, no gallstones and normal HIDA scan  . PONV (postoperative nausea and vomiting)   . Seasonal allergies   . Arthritis     osteoarthritis  . Sinus problem   . Menopause   . Morbid obesity     Current Outpatient Prescriptions  Medication Sig Dispense Refill  . acetaminophen (TYLENOL) 500 MG tablet Take 500 mg by mouth every 6 (six) hours as needed.    Marland Kitchen BIOTIN PO Take 1,000 mcg by mouth daily.    . Calcium Carbonate (CALCIUM 500 PO) Take 3 tablets by mouth daily.    . cholecalciferol (VITAMIN D) 1000 UNITS tablet Take 2,000 Units by mouth daily.     . Melatonin 3 MG TABS Take 2 tablets by mouth at bedtime as needed.    . Multiple Vitamin (MULTIVITAMIN) tablet Take 1 tablet by mouth daily.      No current facility-administered medications for this visit.    No Known Allergies  Family History  Problem Relation Age of Onset  . Lymphoma Father   . Lung cancer Father   . Cancer Father 69    lymphoma, lung  . Osteoarthritis Father   . Hyperlipidemia Mother   . Osteoporosis Mother   . Cancer Mother   . Other Mother     spinal stenosis  . Diabetes Brother   . Other Brother     morbid obesity, lung issues  . Osteoarthritis Brother   . Sleep apnea Brother   . Colon cancer Maternal Grandmother   . Cancer Maternal Grandmother     colon  . Coronary artery disease Paternal Grandmother   . Osteoarthritis Sister   . Hyperlipidemia Sister   . Hypertension Sister   . Other Sister     morbid  obesity    History   Social History  . Marital Status: Divorced    Spouse Name: N/A    Number of Children: 1  . Years of Education: N/A   Occupational History  . Retired from state   . rental properties     part time   Social History Main Topics  . Smoking status: Never Smoker   . Smokeless tobacco: Never Used  . Alcohol Use: 0.6 oz/week    1 Glasses of wine per week     Comment: 1-2 wine on weekends  . Drug Use: No  . Sexual Activity: Not on file   Other Topics Concern  . Not on file   Social History Narrative   Regular exercise: yes, but more limited due to heel pain   Diet:ruits and veggies     Constitutional: Denies fever, malaise, fatigue, headache or abrupt weight changes.  HEENT: Pt reports sores in her nose. Denies eye pain, eye redness, ear pain, ringing in the ears, wax buildup, runny nose, nasal congestion, bloody nose, or sore throat. Respiratory: Denies difficulty breathing, shortness of breath, cough or sputum  production.   Cardiovascular: Denies chest pain, chest tightness, palpitations or swelling in the hands or feet.  Skin: Denies redness, rashes, lesions or ulcercations.   No other specific complaints in a complete review of systems (except as listed in HPI above).  Objective:   Physical Exam  BP 122/74 mmHg  Pulse 62  Temp(Src) 98.4 F (36.9 C) (Oral)  Wt 175 lb (79.379 kg)  SpO2 99% Wt Readings from Last 3 Encounters:  05/13/14 175 lb (79.379 kg)  01/19/14 174 lb (78.926 kg)  09/17/13 172 lb 8 oz (78.245 kg)    General: Appears her stated age, well developed, well nourished in NAD. Skin: Warm, dry and intact. No rashes, lesions or ulcerations noted. HEENT: Head: normal shape and size; Eyes: sclera white, no icterus, conjunctiva pink; Ears: Tm's gray and intact, normal light reflex; Nose: mucosa pink and moist, septum midline, small ulcerations noted in bilateral nares; Throat/Mouth: Teeth present, mucosa pink and moist, no exudate,  lesions or ulcerations noted.   Cardiovascular: Normal rate and rhythm. S1,S2 noted.  No murmur, rubs or gallops noted.  Pulmonary/Chest: Normal effort and positive vesicular breath sounds. No respiratory distress. No wheezes, rales or ronchi noted.   BMET    Component Value Date/Time   NA 140 01/12/2014 1048   K 4.0 01/12/2014 1048   CL 107 01/12/2014 1048   CO2 28 01/12/2014 1048   GLUCOSE 85 01/12/2014 1048   BUN 15 01/12/2014 1048   CREATININE 0.8 01/12/2014 1048   CREATININE 0.95 02/07/2011 1123   CALCIUM 9.1 01/12/2014 1048   GFRNONAA 73* 04/18/2011 1400   GFRAA 84* 04/18/2011 1400    Lipid Panel     Component Value Date/Time   CHOL 168 01/12/2014 1048   TRIG 60.0 01/12/2014 1048   HDL 56.50 01/12/2014 1048   CHOLHDL 3 01/12/2014 1048   VLDL 12.0 01/12/2014 1048   LDLCALC 100* 01/12/2014 1048    CBC    Component Value Date/Time   WBC 5.2 11/24/2012 0834   RBC 4.69 11/24/2012 0834   HGB 14.9 11/24/2012 0834   HCT 44.2 11/24/2012 0834   PLT 216.0 11/24/2012 0834   MCV 94.4 11/24/2012 0834   MCH 30.0 04/18/2011 1400   MCHC 33.8 11/24/2012 0834   RDW 13.9 11/24/2012 0834   LYMPHSABS 1.5 11/24/2012 0834   MONOABS 0.4 11/24/2012 0834   EOSABS 0.1 11/24/2012 0834   BASOSABS 0.0 11/24/2012 0834    Hgb A1C No results found for: HGBA1C       Assessment & Plan:   Nasal sores:  eRx for Bactroban each nare BID Try to avoid touching the inside of your nose Ok to use a nasal saline to help moisten the nasal passages  RTC as needed or if symptoms persist or worsen

## 2014-05-13 NOTE — Progress Notes (Signed)
Pre visit review using our clinic review tool, if applicable. No additional management support is needed unless otherwise documented below in the visit note. 

## 2014-05-13 NOTE — Patient Instructions (Signed)
Mupirocin nasal ointment What is this medicine? MUPIROCIN CALCIUM (myoo PEER oh sin KAL see um) is an antibiotic. It is used inside the nose to treat infections that are caused by certain bacteria. This helps prevent the spread of infection to patients and health care workers during outbreaks at institutions. This medicine may be used for other purposes; ask your health care provider or pharmacist if you have questions. COMMON BRAND NAME(S): Bactroban What should I tell my health care provider before I take this medicine? They need to know if you have any of these conditions: -an unusual or allergic reaction to mupirocin, other medicines, foods, dyes, or preservatives -pregnant or trying to get pregnant -breast-feeding How should I use this medicine? This medicine is only for use inside the nose. Follow the directions on the prescription label. Wash your hands before and after use. Squeeze half the contents of a single-use tube into one nostril, then squeeze the other half into the other nostril. Press the sides of your nose together and gently massage after application to spread the ointment throughout the nostrils. Do not use your medicine more often than directed. Finish the full course of medicine prescribed by your doctor or health care professional even if you think your condition is better. Talk to your pediatrician regarding the use of this medicine in children. Special care may be needed. Overdosage: If you think you have taken too much of this medicine contact a poison control center or emergency room at once. NOTE: This medicine is only for you. Do not share this medicine with others. What if I miss a dose? If you miss a dose, take it as soon as you can. If it is almost time for your next dose, take only that dose. Do not take double or extra doses. What may interact with this medicine? Interactions are not expected. Do not use any other nose products without telling your doctor or  health care professional. This list may not describe all possible interactions. Give your health care provider a list of all the medicines, herbs, non-prescription drugs, or dietary supplements you use. Also tell them if you smoke, drink alcohol, or use illegal drugs. Some items may interact with your medicine. What should I watch for while using this medicine? If your nose is severely irritated, burning or stinging from use of this medicine, stop using it and contact your doctor or health care professional. Do not get this medicine in your eyes. If you do, rinse out with plenty of cool tap water. What side effects may I notice from receiving this medicine? Side effects that you should report to your doctor or health care professional as soon as possible: -severe irritation, burning, stinging, or pain Side effects that usually do not require medical attention (report to your doctor or health care professional if they continue or are bothersome): -altered taste -cough -headache -skin itching -sore throat -stuffy or runny nose This list may not describe all possible side effects. Call your doctor for medical advice about side effects. You may report side effects to FDA at 1-800-FDA-1088. Where should I keep my medicine? Keep out of the reach of children. Store at room temperature between 15 and 30 degrees C (59 and 86 degrees F). Do not refrigerate. One tube of ointment is for single use in both nostrils. Throw away after use. NOTE: This sheet is a summary. It may not cover all possible information. If you have questions about this medicine, talk to your doctor, pharmacist, or  health care provider.  2015, Elsevier/Gold Standard. (2007-11-24 14:36:10)

## 2014-10-12 ENCOUNTER — Encounter: Payer: Self-pay | Admitting: Internal Medicine

## 2014-10-12 ENCOUNTER — Ambulatory Visit (INDEPENDENT_AMBULATORY_CARE_PROVIDER_SITE_OTHER): Payer: BC Managed Care – PPO | Admitting: Internal Medicine

## 2014-10-12 VITALS — BP 118/78 | HR 56 | Temp 98.6°F | Wt 179.0 lb

## 2014-10-12 DIAGNOSIS — L255 Unspecified contact dermatitis due to plants, except food: Secondary | ICD-10-CM

## 2014-10-12 MED ORDER — METHYLPREDNISOLONE ACETATE 80 MG/ML IJ SUSP
80.0000 mg | Freq: Once | INTRAMUSCULAR | Status: AC
  Administered 2014-10-12: 80 mg via INTRAMUSCULAR

## 2014-10-12 MED ORDER — TRIAMCINOLONE ACETONIDE 0.5 % EX OINT: 1.0000 "application " | TOPICAL_OINTMENT | Freq: Two times a day (BID) | CUTANEOUS | Status: DC

## 2014-10-12 NOTE — Patient Instructions (Signed)

## 2014-10-12 NOTE — Progress Notes (Signed)
Pre visit review using our clinic review tool, if applicable. No additional management support is needed unless otherwise documented below in the visit note. 

## 2014-10-12 NOTE — Progress Notes (Signed)
Subjective:    Patient ID: Elizabeth Lowery, female    DOB: December 23, 1955, 59 y.o.   MRN: [id number]  HPI  Pt presents to the clinic today with c/o a rash. This started 1 week ago. The rash is located on her face, legs and arms but seems to be spreading. The rash is very itchy. She has been working outside pulling weeds and thinks she may have gotten into some poison ivy. She has been using Calamine lotion with minimal relief.  Review of Systems      Past Medical History  Diagnosis Date  . Hyperlipidemia   . RUQ pain 2012    ruq pain with fatty lover of u/s, no gallstones and normal HIDA scan  . PONV (postoperative nausea and vomiting)   . Seasonal allergies   . Arthritis     osteoarthritis  . Sinus problem   . Menopause   . Morbid obesity     Current Outpatient Prescriptions  Medication Sig Dispense Refill  . acetaminophen (TYLENOL) 500 MG tablet Take 500 mg by mouth every 6 (six) hours as needed.    Marland Kitchen BIOTIN PO Take 1,000 mcg by mouth daily.    . Calcium Carbonate (CALCIUM 500 PO) Take 3 tablets by mouth daily.    . cholecalciferol (VITAMIN D) 1000 UNITS tablet Take 2,000 Units by mouth daily.     . Melatonin 3 MG TABS Take 2 tablets by mouth at bedtime as needed.    . Multiple Vitamin (MULTIVITAMIN) tablet Take 1 tablet by mouth daily.     . mupirocin ointment (BACTROBAN) 2 % Place 1 application into the nose 2 (two) times daily. 22 g 0   No current facility-administered medications for this visit.    No Known Allergies  Family History  Problem Relation Age of Onset  . Lymphoma Father   . Lung cancer Father   . Cancer Father 35    lymphoma, lung  . Osteoarthritis Father   . Hyperlipidemia Mother   . Osteoporosis Mother   . Cancer Mother   . Other Mother     spinal stenosis  . Diabetes Brother   . Other Brother     morbid obesity, lung issues  . Osteoarthritis Brother   . Sleep apnea Brother   . Colon cancer Maternal Grandmother   . Cancer Maternal  Grandmother     colon  . Coronary artery disease Paternal Grandmother   . Osteoarthritis Sister   . Hyperlipidemia Sister   . Hypertension Sister   . Other Sister     morbid obesity    History   Social History  . Marital Status: Divorced    Spouse Name: N/A  . Number of Children: 1  . Years of Education: N/A   Occupational History  . Retired from state   . rental properties     part time   Social History Main Topics  . Smoking status: Never Smoker   . Smokeless tobacco: Never Used  . Alcohol Use: 0.6 oz/week    1 Glasses of wine per week     Comment: 1-2 wine on weekends  . Drug Use: No  . Sexual Activity: Not on file   Other Topics Concern  . Not on file   Social History Narrative   Regular exercise: yes, but more limited due to heel pain   Diet:ruits and veggies     Constitutional: Denies fever, malaise, fatigue, headache or abrupt weight changes.  Respiratory: Denies difficulty breathing, shortness  of breath, cough or sputum production.   Cardiovascular: Denies chest pain, chest tightness, palpitations or swelling in the hands or feet.  Skin: Pt reports rash. Denies ulcercations.   No other specific complaints in a complete review of systems (except as listed in HPI above).  Objective:   Physical Exam     BP 118/78 mmHg  Pulse 56  Temp(Src) 98.6 F (37 C) (Oral)  Wt 179 lb (81.194 kg)  SpO2 97% Wt Readings from Last 3 Encounters:  10/12/14 179 lb (81.194 kg)  05/13/14 175 lb (79.379 kg)  01/19/14 174 lb (78.926 kg)    General: Appears her stated age, in NAD. Skin: Vesicular lesions on erythematous base noted in linear pattern on her left hand, right arm and right side of face. Cardiovascular: Normal rate and rhythm. S1,S2 noted.  No murmur, rubs or gallops noted.  Pulmonary/Chest: Normal effort and positive vesicular breath sounds. No respiratory distress. No wheezes, rales or ronchi noted.  Neurological: Alert and oriented.   BMET      Component Value Date/Time   NA 140 01/12/2014 1048   K 4.0 01/12/2014 1048   CL 107 01/12/2014 1048   CO2 28 01/12/2014 1048   GLUCOSE 85 01/12/2014 1048   BUN 15 01/12/2014 1048   CREATININE 0.8 01/12/2014 1048   CREATININE 0.95 02/07/2011 1123   CALCIUM 9.1 01/12/2014 1048   GFRNONAA 73* 04/18/2011 1400   GFRAA 84* 04/18/2011 1400    Lipid Panel     Component Value Date/Time   CHOL 168 01/12/2014 1048   TRIG 60.0 01/12/2014 1048   HDL 56.50 01/12/2014 1048   CHOLHDL 3 01/12/2014 1048   VLDL 12.0 01/12/2014 1048   LDLCALC 100* 01/12/2014 1048    CBC    Component Value Date/Time   WBC 5.2 11/24/2012 0834   RBC 4.69 11/24/2012 0834   HGB 14.9 11/24/2012 0834   HCT 44.2 11/24/2012 0834   PLT 216.0 11/24/2012 0834   MCV 94.4 11/24/2012 0834   MCH 30.0 04/18/2011 1400   MCHC 33.8 11/24/2012 0834   RDW 13.9 11/24/2012 0834   LYMPHSABS 1.5 11/24/2012 0834   MONOABS 0.4 11/24/2012 0834   EOSABS 0.1 11/24/2012 0834   BASOSABS 0.0 11/24/2012 0834    Hgb A1C No results found for: HGBA1C  Assessment & Plan:   Contact Dermatitis due to Plant:   80 mg Depo IM today eRx for Triamcinolone cream BID except on your face Continue Calamine lotion as needed  RTC as needed or if symptoms persist or worsen

## 2014-10-12 NOTE — Addendum Note (Signed)
Addended by: Lurlean Nanny on: 10/12/2014 10:11 AM   Modules accepted: Orders

## 2014-12-17 ENCOUNTER — Telehealth: Payer: BC Managed Care – PPO | Admitting: Nurse Practitioner

## 2014-12-17 DIAGNOSIS — N3000 Acute cystitis without hematuria: Secondary | ICD-10-CM

## 2014-12-17 MED ORDER — CIPROFLOXACIN HCL 500 MG PO TABS: 500.0000 mg | ORAL_TABLET | Freq: Two times a day (BID) | ORAL | Status: DC

## 2014-12-17 NOTE — Progress Notes (Signed)
We are sorry that you are not feeling well.  Here is how we plan to help!  Based on what you shared with me it looks like you most likely have a simple urinary tract infection.  A UTI (Urinary Tract Infection) is a bacterial infection of the bladder.  Most cases of urinary tract infections are simple to treat but a key part of your care is to encourage you to drink plenty of fluids and watch your symptoms carefully.  I have prescribed Ciprofloxacin 500 mg twice a day for 5 days.  Your symptoms should gradually improve. Call us if the burning in your urine worsens, you develop worsening fever, back pain or pelvic pain or if your symptoms do not resolve after completing the antibiotic.  Urinary tract infections can be prevented by drinking plenty of water to keep your body hydrated.  Also be sure when you wipe, wipe from front to back and don't hold it in!  If possible, empty your bladder every 4 hours.  Your e-visit answers were reviewed by a board certified advanced clinical practitioner to complete your personal care plan.  Depending on the condition, your plan could have included both over the counter or prescription medications.  If there is a problem please reply  once you have received a response from your provider.  Your safety is important to us.  If you have drug allergies check your prescription carefully.    You can use MyChart to ask questions about today's visit, request a non-urgent call back, or ask for a work or school excuse.  You will get an e-mail in the next two days asking about your experience.  I hope that your e-visit has been valuable and will speed your recovery. Thank you for using e-visits.    

## 2015-02-08 ENCOUNTER — Telehealth: Payer: Self-pay | Admitting: Family Medicine

## 2015-02-08 NOTE — Telephone Encounter (Signed)
Nadia notified she will need to get a PPD before form can be completed.  Nurse Visit scheduled 02/16/2015 at 9:00 am for PPD.

## 2015-02-08 NOTE — Telephone Encounter (Signed)
Form placed in Dr. Rometta Emery in box to complete.  Patient will need a PPD.

## 2015-02-08 NOTE — Telephone Encounter (Signed)
In outbox

## 2015-02-08 NOTE — Telephone Encounter (Signed)
Pt dropped off Health Examination Form to be completed. Form in Donna's inbox. Thank you

## 2015-02-16 ENCOUNTER — Ambulatory Visit (INDEPENDENT_AMBULATORY_CARE_PROVIDER_SITE_OTHER): Payer: BC Managed Care – PPO

## 2015-02-16 DIAGNOSIS — Z23 Encounter for immunization: Secondary | ICD-10-CM

## 2015-02-16 DIAGNOSIS — Z111 Encounter for screening for respiratory tuberculosis: Secondary | ICD-10-CM | POA: Diagnosis not present

## 2015-02-18 LAB — TB SKIN TEST: TB SKIN TEST: NEGATIVE

## 2015-03-18 ENCOUNTER — Encounter: Payer: Self-pay | Admitting: Family Medicine

## 2015-04-12 ENCOUNTER — Telehealth: Payer: Self-pay | Admitting: Family

## 2015-04-12 ENCOUNTER — Other Ambulatory Visit (INDEPENDENT_AMBULATORY_CARE_PROVIDER_SITE_OTHER): Payer: BC Managed Care – PPO

## 2015-04-12 ENCOUNTER — Telehealth: Payer: Self-pay | Admitting: Family Medicine

## 2015-04-12 ENCOUNTER — Encounter: Payer: Self-pay | Admitting: Family Medicine

## 2015-04-12 DIAGNOSIS — J0120 Acute ethmoidal sinusitis, unspecified: Secondary | ICD-10-CM

## 2015-04-12 DIAGNOSIS — E785 Hyperlipidemia, unspecified: Secondary | ICD-10-CM

## 2015-04-12 LAB — COMPREHENSIVE METABOLIC PANEL
ALT: 16 U/L (ref 0–35)
AST: 16 U/L (ref 0–37)
Albumin: 4 g/dL (ref 3.5–5.2)
Alkaline Phosphatase: 52 U/L (ref 39–117)
BUN: 17 mg/dL (ref 6–23)
CALCIUM: 9.4 mg/dL (ref 8.4–10.5)
CHLORIDE: 107 meq/L (ref 96–112)
CO2: 29 meq/L (ref 19–32)
CREATININE: 0.87 mg/dL (ref 0.40–1.20)
GFR: 70.28 mL/min (ref >60.00)
GLUCOSE: 82 mg/dL (ref 70–99)
Potassium: 4 mEq/L (ref 3.5–5.1)
Sodium: 142 mEq/L (ref 135–145)
Total Bilirubin: 0.6 mg/dL (ref 0.2–1.2)
Total Protein: 6.7 g/dL (ref 6.0–8.3)

## 2015-04-12 LAB — LIPID PANEL
CHOL/HDL RATIO: 4
Cholesterol: 200 mg/dL (ref 0–200)
HDL: 54.2 mg/dL (ref >39.00)
LDL CALC: 130 mg/dL — AB (ref 0–99)
NonHDL: 146.05
Triglycerides: 78 mg/dL (ref 0.0–149.0)
VLDL: 15.6 mg/dL (ref 0.0–40.0)

## 2015-04-12 MED ORDER — AMOXICILLIN-POT CLAVULANATE 875-125 MG PO TABS
1.0000 | ORAL_TABLET | Freq: Two times a day (BID) | ORAL | Status: DC
Start: 2015-04-12 — End: 2015-04-21

## 2015-04-12 NOTE — Progress Notes (Signed)
We are sorry that you are not feeling well.  Here is how we plan to help!  Based on what you have shared with me it looks like you have sinusitis.  Sinusitis is inflammation and infection in the sinus cavities of the head.  Based on your presentation I believe you most likely have Acute Bacterial Sinusitis.  This is an infection caused by bacteria and is treated with antibiotics. I have prescribed Augmentin, an antibiotic in the penicillin family, one tablet twice daily with food, for 7 days. You may use an oral decongestant such as Mucinex D or if you have glaucoma or high blood pressure use plain Mucinex. Saline nasal spray help and can safely be used as often as needed for congestion.  If you develop worsening sinus pain, fever or notice severe headache and vision changes, or if symptoms are not better after completion of antibiotic, please schedule an appointment with a health care provider.    Sinus infections are not as easily transmitted as other respiratory infection, however we still recommend that you avoid close contact with loved ones, especially the very young and elderly.  Remember to wash your hands thoroughly throughout the day as this is the number one way to prevent the spread of infection!  Home Care:  Only take medications as instructed by your medical team.  Complete the entire course of an antibiotic.  Do not take these medications with alcohol.  A steam or ultrasonic humidifier can help congestion.  You can place a towel over your head and breathe in the steam from hot water coming from a faucet.  Avoid close contacts especially the very young and the elderly.  Cover your mouth when you cough or sneeze.  Always remember to wash your hands.  Get Help Right Away If:  You develop worsening fever or sinus pain.  You develop a severe head ache or visual changes.  Your symptoms persist after you have completed your treatment plan.  Make sure you  Understand these  instructions.  Will watch your condition.  Will get help right away if you are not doing well or get worse.  Your e-visit answers were reviewed by a board certified advanced clinical practitioner to complete your personal care plan.  Depending on the condition, your plan could have included both over the counter or prescription medications.  If there is a problem please reply  once you have received a response from your provider.  Your safety is important to [place].  If you have drug allergies check your prescription carefully.    You can use MyChart to ask questions about today's visit, request a non-urgent call back, or ask for a work or school excuse for 24 hours related to this e-Visit. If it has been greater than 24 hours you will need to follow up with your provider, or enter a new e-Visit to address those concerns.  You will get an e-mail in the next two days asking about your experience.  I hope that your e-visit has been valuable and will speed your recovery. Thank you for using e-visits.

## 2015-04-12 NOTE — Telephone Encounter (Signed)
-----   Message from Ellamae Sia sent at 04/05/2015  4:15 PM EST ----- Regarding: Lab orders for Tuesdday, 11.22.16 Patient is scheduled for CPX labs, please order future labs, Thanks , Karna Christmas

## 2015-04-21 ENCOUNTER — Ambulatory Visit (INDEPENDENT_AMBULATORY_CARE_PROVIDER_SITE_OTHER): Payer: BC Managed Care – PPO | Admitting: Family Medicine

## 2015-04-21 ENCOUNTER — Encounter: Payer: Self-pay | Admitting: Family Medicine

## 2015-04-21 VITALS — BP 114/80 | HR 68 | Temp 98.4°F | Ht 63.5 in | Wt 187.0 lb

## 2015-04-21 DIAGNOSIS — E669 Obesity, unspecified: Secondary | ICD-10-CM | POA: Insufficient documentation

## 2015-04-21 DIAGNOSIS — Z0000 Encounter for general adult medical examination without abnormal findings: Secondary | ICD-10-CM

## 2015-04-21 DIAGNOSIS — Z6833 Body mass index (BMI) 33.0-33.9, adult: Secondary | ICD-10-CM | POA: Insufficient documentation

## 2015-04-21 DIAGNOSIS — Z1211 Encounter for screening for malignant neoplasm of colon: Secondary | ICD-10-CM

## 2015-04-21 DIAGNOSIS — E785 Hyperlipidemia, unspecified: Secondary | ICD-10-CM

## 2015-04-21 NOTE — Assessment & Plan Note (Signed)
Borderline control, she will lose the weight  she gained in last year and get back to low chol diet.

## 2015-04-21 NOTE — Progress Notes (Signed)
Pre visit review using our clinic review tool, if applicable. No additional management support is needed unless otherwise documented below in the visit note. 

## 2015-04-21 NOTE — Assessment & Plan Note (Signed)
Encouraged exercise, weight loss, healthy eating habits. ? ?

## 2015-04-21 NOTE — Progress Notes (Signed)
The patient is here for annual wellness exam and preventative care.   Sinus infection, improved after antibitoics.  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 Lucia Gaskins).  Wt Readings from Last 3 Encounters:  04/21/15 187 lb (84.823 kg)  10/12/14 179 lb (81.194 kg)  05/13/14 175 lb (79.379 kg)   Elevated Cholesterol: At goal <130 off simvastatin.  Lab Results  Component Value Date   CHOL 200 04/12/2015   HDL 54.20 04/12/2015   LDLCALC 130* 04/12/2015   LDLDIRECT 131.4 11/26/2011   TRIG 78.0 04/12/2015   CHOLHDL 4 04/12/2015  Using medications without problems:None.  Muscle aches: None  Other complaints:  Weight is stable.. On high protein low carb diet.  Exercising 3-4 times a week: walking 3 miles.   Body mass index is 32.6 kg/(m^2).   Feels much better overall with weight loss. Moving better.   Review of Systems  Constitutional: Negative for fever and fatigue.  HENT: Negative for ear pain.  Eyes: Negative for pain.  Respiratory: Negative for shortness of breath and wheezing.  Cardiovascular: Negative for chest pain, palpitations and leg swelling.  Gastrointestinal: Negative for diarrhea, constipation, blood in stool and abdominal distention.  Genitourinary: Negative for dysuria, vaginal bleeding, vaginal discharge, vaginal pain and pelvic pain.  Skin: Negative for rash.  Psychiatric/Behavioral: Negative for dysphoric mood No anxiety. Objective:   Physical Exam  Constitutional: Vital signs are normal. She appears well-developed and well-nourished. She is cooperative. Non-toxic appearance. She does not appear ill. No distress.  HENT:  Head: Normocephalic.  Right Ear: Hearing, tympanic membrane, external ear and ear canal normal.  Left Ear: Hearing, tympanic membrane, external ear and ear canal normal.  Nose: Nose normal.  Eyes: Conjunctivae, EOM and lids are normal. Pupils are equal, round, and reactive to light. No  foreign bodies found.  Neck: Trachea normal and normal range of motion. Neck supple. Carotid bruit is not present. No mass and no thyromegaly present.  Cardiovascular: Normal rate, regular rhythm, S1 normal, S2 normal, normal heart sounds and intact distal pulses. Exam reveals no gallop.  No murmur heard.  Pulmonary/Chest: Effort normal and breath sounds normal. No respiratory distress. She has no wheezes. She has no rhonchi. She has no rales.  Abdominal: Soft. Normal appearance and bowel sounds are normal. She exhibits no distension, no fluid wave, no abdominal bruit and no mass. There is no hepatosplenomegaly. There is no tenderness. There is no rebound, no guarding and no CVA tenderness. No hernia.  Genitourinary: Vagina normal and uterus normal. No breast swelling, tenderness, discharge or bleeding. Pelvic exam was performed with patient supine. There is no rash, tenderness or lesion on the right labia. There is no rash, tenderness or lesion on the left labia. Uterus is not enlarged and not tender. Right adnexum displays no mass, no tenderness and no fullness. Left adnexum displays no mass, no tenderness and no fullness. PAP NOT PERFORMED. Lymphadenopathy:  She has no cervical adenopathy.  She has no axillary adenopathy.  Neurological: She is alert. She has normal strength. No cranial nerve deficit or sensory deficit.  Skin: Skin is warm, dry and intact. No rash noted.  Psychiatric: Her speech is normal and behavior is normal. Judgment normal. Her mood appears not anxious. Cognition and memory are normal. She does not exhibit a depressed mood.  Assessment & Plan:   Complete Physical Exam: The patient's preventative maintenance and recommended screening tests for an annual wellness exam were reviewed in full today.  Brought  up to date unless services declined.  Counselled on the importance of diet, exercise, and its role in overall health and mortality.  The patient's FH and SH was  reviewed, including their home life, tobacco status, and drug and alcohol status.   PAP, every 3 years, Last done 2016, DVE exam done yearly. Mammo 04/11/2015 Colon, last nml in 2006: due In 2016.   Vaccines:  UptoDate. DEXA: nml 12/2011 repeat in 5 years Hep C:  Plan to do next year. HIV: refused.

## 2015-04-21 NOTE — Patient Instructions (Addendum)
Return to low cholesterol and low fat diet.  Keep up with exercise.  Work on weight loss.  Stop at front desk to set up referral  to GI.

## 2015-04-22 ENCOUNTER — Encounter: Payer: Self-pay | Admitting: Internal Medicine

## 2015-06-17 ENCOUNTER — Ambulatory Visit (AMBULATORY_SURGERY_CENTER): Payer: Self-pay | Admitting: *Deleted

## 2015-06-17 VITALS — Ht 64.0 in | Wt 191.2 lb

## 2015-06-17 DIAGNOSIS — Z1211 Encounter for screening for malignant neoplasm of colon: Secondary | ICD-10-CM

## 2015-06-17 NOTE — Progress Notes (Signed)
Denies allergies to eggs or soy products. Denies complications with sedation or anesthesia. Denies O2 use. Denies use of diet or weight loss medications.  Emmi instructions given for colonoscopy.  

## 2015-07-01 ENCOUNTER — Encounter: Payer: BC Managed Care – PPO | Admitting: Internal Medicine

## 2015-08-25 ENCOUNTER — Ambulatory Visit (AMBULATORY_SURGERY_CENTER): Payer: BC Managed Care – PPO | Admitting: Internal Medicine

## 2015-08-25 ENCOUNTER — Encounter: Payer: Self-pay | Admitting: Internal Medicine

## 2015-08-25 VITALS — BP 119/75 | HR 62 | Temp 98.0°F | Resp 12 | Ht 64.0 in | Wt 191.0 lb

## 2015-08-25 DIAGNOSIS — Z1211 Encounter for screening for malignant neoplasm of colon: Secondary | ICD-10-CM | POA: Diagnosis present

## 2015-08-25 DIAGNOSIS — D123 Benign neoplasm of transverse colon: Secondary | ICD-10-CM | POA: Diagnosis not present

## 2015-08-25 MED ORDER — SODIUM CHLORIDE 0.9 % IV SOLN: 500.0000 mL | INTRAVENOUS | Status: DC

## 2015-08-25 NOTE — Progress Notes (Signed)
Report to PACU, RN, vss, BBS= Clear.  

## 2015-08-25 NOTE — Progress Notes (Signed)
Called to room to assist during endoscopic procedure.  Patient ID and intended procedure confirmed with present staff. Received instructions for my participation in the procedure from the performing physician.  

## 2015-08-25 NOTE — Patient Instructions (Addendum)
 I found and removed one tiny polyp.    You also have a condition called diverticulosis - common and not usually a problem. Please read the handout provided.  I will let you know pathology results and when to have another routine colonoscopy by mail.  I appreciate the opportunity to care for you. Zakai Gonyea E. Hayven Croy, MD, FACG   Discharge instructions given. Handouts on polyps and diverticulosis. Resume previous medications. YOU HAD AN ENDOSCOPIC PROCEDURE TODAY AT THE [facility]:   Refer to the procedure report that was given to you for any specific questions about what was found during the examination.  If the procedure report does not answer your questions, please call your gastroenterologist to clarify.  If you requested that your care partner not be given the details of your procedure findings, then the procedure report has been included in a sealed envelope for you to review at your convenience later.  YOU SHOULD EXPECT: Some feelings of bloating in the abdomen. Passage of more gas than usual.  Walking can help get rid of the air that was put into your GI tract during the procedure and reduce the bloating. If you had a lower endoscopy (such as a colonoscopy or flexible sigmoidoscopy) you may notice spotting of blood in your stool or on the toilet paper. If you underwent a bowel prep for your procedure, you may not have a normal bowel movement for a few days.  Please Note:  You might notice some irritation and congestion in your nose or some drainage.  This is from the oxygen used during your procedure.  There is no need for concern and it should clear up in a day or so.  SYMPTOMS TO REPORT IMMEDIATELY:   Following lower endoscopy (colonoscopy or flexible sigmoidoscopy):  Excessive amounts of blood in the stool  Significant tenderness or worsening of abdominal pains  Swelling of the abdomen that is new, acute  Fever of 100F or higher   For urgent or emergent  issues, a gastroenterologist can be reached at any hour by calling [phone].   DIET: Your first meal following the procedure should be a small meal and then it is ok to progress to your normal diet. Heavy or fried foods are harder to digest and may make you feel nauseous or bloated.  Likewise, meals heavy in dairy and vegetables can increase bloating.  Drink plenty of fluids but you should avoid alcoholic beverages for 24 hours.  ACTIVITY:  You should plan to take it easy for the rest of today and you should NOT DRIVE or use heavy machinery until tomorrow (because of the sedation medicines used during the test).    FOLLOW UP: Our staff will call the number listed on your records the next business day following your procedure to check on you and address any questions or concerns that you may have regarding the information given to you following your procedure. If we do not reach you, we will leave a message.  However, if you are feeling well and you are not experiencing any problems, there is no need to return our call.  We will assume that you have returned to your regular daily activities without incident.  If any biopsies were taken you will be contacted by phone or by letter within the next 1-3 weeks.  Please call us at [phone] if you have not heard about the biopsies in 3 weeks.    SIGNATURES/CONFIDENTIALITY: You and/or your care partner have signed   will be entered into your electronic medical record.  These signatures attest to the fact that that the information above on your After Visit Summary has been reviewed and is understood.  Full responsibility of the confidentiality of this discharge information lies with you and/or your care-partner.

## 2015-08-25 NOTE — Op Note (Signed)
[facility] Patient Name: Elizabeth Lowery Procedure Date: 08/25/2015 8:43 AM MRN: [id number] Endoscopist: Gatha Mayer , MD Age: 60 Date of Birth: 14-Dec-1955 Gender: Female Procedure:                Colonoscopy Indications:              Screening for colorectal malignant neoplasm Medicines:                Propofol per Anesthesia, Monitored Anesthesia Care Procedure:                Pre-Anesthesia Assessment:                           - Prior to the procedure, a History and Physical                            was performed, and patient medications and                            allergies were reviewed. The patient's tolerance of                            previous anesthesia was also reviewed. The risks                            and benefits of the procedure and the sedation                            options and risks were discussed with the patient.                            All questions were answered, and informed consent                            was obtained. Prior Anticoagulants: The patient has                            taken no previous anticoagulant or antiplatelet                            agents. ASA Grade Assessment: II - A patient with                            mild systemic disease. After reviewing the risks                            and benefits, the patient was deemed in                            satisfactory condition to undergo the procedure.                           After obtaining informed consent, the colonoscope  was passed under direct vision. Throughout the                            procedure, the patient's blood pressure, pulse, and                            oxygen saturations were monitored continuously. The                            Model CF-HQ190L [id number]) scope was introduced                            through the anus and advanced to the the cecum,                            identified by appendiceal orifice  and ileocecal                            valve. The colonoscopy was performed without                            difficulty. The patient tolerated the procedure                            well. The quality of the bowel preparation was                            excellent. The bowel preparation used was Miralax.                            The ileocecal valve, appendiceal orifice, and                            rectum were photographed. Scope In: 8:54:33 AM Scope Out: 9:05:55 AM Scope Withdrawal Time: 0 hours 8 minutes 12 seconds  Total Procedure Duration: 0 hours 11 minutes 22 seconds  Findings:                 The perianal and digital rectal examinations were                            normal.                           A 4 mm polyp was found in the transverse colon. The                            polyp was sessile. The polyp was removed with a                            cold snare. Resection and retrieval were complete.                            Verification of patient identification for the  specimen was done. Estimated blood loss was minimal.                           Diverticula were found in the sigmoid colon.                           The exam was otherwise without abnormality on                            direct and retroflexion views. Complications:            No immediate complications. Estimated blood loss:                            Minimal. Estimated Blood Loss:     Estimated blood loss was minimal. Impression:               - One 4 mm polyp in the transverse colon, removed                            with a cold snare. Resected and retrieved.                           - Moderate diverticulosis in the sigmoid colon.                           - The examination was otherwise normal on direct                            and retroflexion views. Recommendation:           - Written discharge instructions were provided to                            the  patient.                           - Written discharge instructions were provided to                            the patient.                           - The signs and symptoms of potential delayed                            complications were discussed with the patient.                           - Patient has a contact number available for                            emergencies.                           - Return to normal activities tomorrow.                           -  Resume previous diet.                           - Continue present medications.                           - Await pathology results.                           - Repeat colonoscopy is recommended. The                            colonoscopy date will be determined after pathology                            results from today's exam become available for                            review. Gatha Mayer, MD 08/25/2015 9:11:03 AM This report has been signed electronically. CC Letter to:             Jinny Sanders, MD

## 2015-08-26 ENCOUNTER — Telehealth: Payer: Self-pay | Admitting: Nurse Practitioner

## 2015-08-26 ENCOUNTER — Telehealth: Payer: Self-pay | Admitting: *Deleted

## 2015-08-26 DIAGNOSIS — N3000 Acute cystitis without hematuria: Secondary | ICD-10-CM

## 2015-08-26 MED ORDER — SULFAMETHOXAZOLE-TRIMETHOPRIM 800-160 MG PO TABS: 1.0000 | ORAL_TABLET | Freq: Two times a day (BID) | ORAL | Status: DC

## 2015-08-26 NOTE — Telephone Encounter (Signed)
  Follow up Call-  Call back number 08/25/2015  Post procedure Call Back phone  # [phone]  Permission to leave phone message Yes     Patient questions:  Do you have a fever, pain , or abdominal swelling? No. Pain Score  0 *  Have you tolerated food without any problems? Yes.    Have you been able to return to your normal activities? Yes.    Do you have any questions about your discharge instructions: Diet   No. Medications  No. Follow up visit  No.  Do you have questions or concerns about your Care? No.  Actions: * If pain score is 4 or above: No action needed, pain <4.

## 2015-08-26 NOTE — Progress Notes (Signed)

## 2015-08-31 ENCOUNTER — Encounter: Payer: Self-pay | Admitting: Internal Medicine

## 2015-08-31 NOTE — Progress Notes (Signed)
Quick Note:  Not a polyp Recall 2027 ______

## 2015-11-18 ENCOUNTER — Telehealth: Payer: BC Managed Care – PPO | Admitting: Nurse Practitioner

## 2015-11-18 DIAGNOSIS — N3000 Acute cystitis without hematuria: Secondary | ICD-10-CM

## 2015-11-18 MED ORDER — SULFAMETHOXAZOLE-TRIMETHOPRIM 800-160 MG PO TABS: 1.0000 | ORAL_TABLET | Freq: Two times a day (BID) | ORAL | Status: DC

## 2015-11-18 NOTE — Progress Notes (Signed)

## 2016-04-05 ENCOUNTER — Telehealth: Payer: Self-pay | Admitting: Family Medicine

## 2016-04-05 DIAGNOSIS — Z1159 Encounter for screening for other viral diseases: Secondary | ICD-10-CM

## 2016-04-05 DIAGNOSIS — E782 Mixed hyperlipidemia: Secondary | ICD-10-CM

## 2016-04-05 NOTE — Telephone Encounter (Signed)
-----   Message from Marchia Bond sent at 04/05/2016  2:42 PM EST ----- Regarding: Cpx labs Mon 11/27, need orders. Thanks! :-) Please order  future cpx labs for pt's upcoming lab appt. Thanks Aniceto Boss

## 2016-04-16 ENCOUNTER — Other Ambulatory Visit (INDEPENDENT_AMBULATORY_CARE_PROVIDER_SITE_OTHER): Payer: BC Managed Care – PPO

## 2016-04-16 DIAGNOSIS — Z1159 Encounter for screening for other viral diseases: Secondary | ICD-10-CM

## 2016-04-16 DIAGNOSIS — E782 Mixed hyperlipidemia: Secondary | ICD-10-CM

## 2016-04-16 LAB — LIPID PANEL
CHOL/HDL RATIO: 4
Cholesterol: 216 mg/dL — ABNORMAL HIGH (ref 0–200)
HDL: 55 mg/dL (ref >39.00)
LDL CALC: 150 mg/dL — AB (ref 0–99)
NonHDL: 160.72
TRIGLYCERIDES: 55 mg/dL (ref 0.0–149.0)
VLDL: 11 mg/dL (ref 0.0–40.0)

## 2016-04-16 LAB — COMPREHENSIVE METABOLIC PANEL
ALT: 17 U/L (ref 0–35)
AST: 17 U/L (ref 0–37)
Albumin: 4 g/dL (ref 3.5–5.2)
Alkaline Phosphatase: 51 U/L (ref 39–117)
BUN: 21 mg/dL (ref 6–23)
CALCIUM: 9.2 mg/dL (ref 8.4–10.5)
CHLORIDE: 109 meq/L (ref 96–112)
CO2: 27 meq/L (ref 19–32)
Creatinine, Ser: 0.87 mg/dL (ref 0.40–1.20)
GFR: 70.04 mL/min (ref >60.00)
Glucose, Bld: 88 mg/dL (ref 70–99)
Potassium: 3.9 mEq/L (ref 3.5–5.1)
Sodium: 143 mEq/L (ref 135–145)
Total Bilirubin: 0.4 mg/dL (ref 0.2–1.2)
Total Protein: 6.5 g/dL (ref 6.0–8.3)

## 2016-04-17 LAB — HEPATITIS C ANTIBODY: HCV AB: NEGATIVE

## 2016-04-19 ENCOUNTER — Ambulatory Visit (INDEPENDENT_AMBULATORY_CARE_PROVIDER_SITE_OTHER): Payer: BC Managed Care – PPO | Admitting: Family Medicine

## 2016-04-19 ENCOUNTER — Ambulatory Visit (INDEPENDENT_AMBULATORY_CARE_PROVIDER_SITE_OTHER)
Admission: RE | Admit: 2016-04-19 | Discharge: 2016-04-19 | Disposition: A | Discharge status: Home / Self Care | Payer: BC Managed Care – PPO | Source: Ambulatory Visit | Attending: Family Medicine | Admitting: Family Medicine

## 2016-04-19 ENCOUNTER — Encounter: Payer: Self-pay | Admitting: Family Medicine

## 2016-04-19 VITALS — BP 147/89 | HR 72 | Temp 98.1°F | Ht 63.5 in | Wt 192.5 lb

## 2016-04-19 DIAGNOSIS — G8929 Other chronic pain: Secondary | ICD-10-CM

## 2016-04-19 DIAGNOSIS — I10 Essential (primary) hypertension: Secondary | ICD-10-CM | POA: Insufficient documentation

## 2016-04-19 DIAGNOSIS — M25562 Pain in left knee: Secondary | ICD-10-CM

## 2016-04-19 DIAGNOSIS — Z0001 Encounter for general adult medical examination with abnormal findings: Secondary | ICD-10-CM

## 2016-04-19 DIAGNOSIS — R030 Elevated blood-pressure reading, without diagnosis of hypertension: Secondary | ICD-10-CM

## 2016-04-19 DIAGNOSIS — Z0000 Encounter for general adult medical examination without abnormal findings: Secondary | ICD-10-CM

## 2016-04-19 NOTE — Assessment & Plan Note (Signed)
Possibly due to NSAIDs, pain. Follow at home.

## 2016-04-19 NOTE — Assessment & Plan Note (Addendum)
Possible OA or meniscal injury.  Start with eval with X-ray. NSAIDs prn.

## 2016-04-19 NOTE — Progress Notes (Signed)
Pre visit review using our clinic review tool, if applicable. No additional management support is needed unless otherwise documented below in the visit note. 

## 2016-04-19 NOTE — Progress Notes (Signed)
Subjective:    Patient ID: Elizabeth Lowery, female    DOB: July 05, 1955, 60 y.o.   MRN: [id number]  HPI  The patient is here for annual wellness exam and preventative care.    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 Lucia Gaskins).  Wt Readings from Last 3 Encounters:  04/19/16 192 lb 8 oz (87.3 kg)  08/25/15 191 lb (86.6 kg)  06/17/15 191 lb 3.2 oz (86.7 kg)  Body mass index is 33.56 kg/m.  Elevated Cholesterol: low risk for CVD. Lab Results  Component Value Date   CHOL 216 (H) 04/16/2016   HDL 55.00 04/16/2016   LDLCALC 150 (H) 04/16/2016   LDLDIRECT 131.4 11/26/2011   TRIG 55.0 04/16/2016   CHOLHDL 4 04/16/2016  Using medications without problems: Muscle aches:  Diet compliance: poor.. Plans to return to bariatric MD for band tightening. Exercise: none given knee Other complaints:    BP is up given she has left knee pain. She has been using ibuprofen 800 mg daily No checking at home.. BP Readings from Last 3 Encounters:  04/19/16 (!) 147/89  08/25/15 119/75  04/21/15 114/80    Left knee pain... Present in last year. Pain off and on until the last month. No known injury. No fall, no change in activity other than exercise. pain medical on knee.   Good ROM. Not popping,not locking. Knee feels unstable. Wearing brace when working.   Social History /Family History/Past Medical History reviewed and updated if needed.   Review of Systems  Constitutional: Negative for fatigue and fever.  HENT: Negative for congestion.   Eyes: Negative for pain.  Respiratory: Negative for cough and shortness of breath.   Cardiovascular: Negative for chest pain, palpitations and leg swelling.  Gastrointestinal: Negative for abdominal pain.  Genitourinary: Negative for dysuria and vaginal bleeding.  Musculoskeletal: Positive for arthralgias.  Neurological: Negative for syncope, light-headedness and headaches.  Psychiatric/Behavioral: Negative for dysphoric  mood.       Objective:   Physical Exam  Constitutional: Vital signs are normal. She appears well-developed and well-nourished. She is cooperative.  Non-toxic appearance. She does not appear ill. No distress.  HENT:  Head: Normocephalic.  Right Ear: Hearing, tympanic membrane, external ear and ear canal normal.  Left Ear: Hearing, tympanic membrane, external ear and ear canal normal.  Nose: Nose normal.  Eyes: Conjunctivae, EOM and lids are normal. Pupils are equal, round, and reactive to light. Lids are everted and swept, no foreign bodies found.  Neck: Trachea normal and normal range of motion. Neck supple. Carotid bruit is not present. No thyroid mass and no thyromegaly present.  Cardiovascular: Normal rate, regular rhythm, S1 normal, S2 normal, normal heart sounds and intact distal pulses.  Exam reveals no gallop.   No murmur heard. Pulmonary/Chest: Effort normal and breath sounds normal. No respiratory distress. She has no wheezes. She has no rhonchi. She has no rales.  Abdominal: Soft. Normal appearance and bowel sounds are normal. She exhibits no distension, no fluid wave, no abdominal bruit and no mass. There is no hepatosplenomegaly. There is no tenderness. There is no rebound, no guarding and no CVA tenderness. No hernia.  Musculoskeletal:       Left knee: She exhibits normal range of motion, no swelling, normal patellar mobility, no bony tenderness, normal meniscus and no MCL laxity. Tenderness found. Medial joint line tenderness noted. No lateral joint line, no MCL, no LCL and no patellar tendon tenderness noted.  Neg mc murray's  Lymphadenopathy:    She has no cervical adenopathy.    She has no axillary adenopathy.  Neurological: She is alert. She has normal strength. No cranial nerve deficit or sensory deficit.  Skin: Skin is warm, dry and intact. No rash noted.  Psychiatric: Her speech is normal and behavior is normal. Judgment normal. Her mood appears not anxious. Cognition  and memory are normal. She does not exhibit a depressed mood.          Assessment & Plan:  The patient's preventative maintenance and recommended screening tests for an annual wellness exam were reviewed in full today. Brought up to date unless services declined.  Counselled on the importance of diet, exercise, and its role in overall health and mortality. The patient's FH and SH was reviewed, including their home life, tobacco status, and drug and alcohol status.   PAP, every 5 years, Last done 2016, DVE exam deferred, no indication Mammo 04/11/2015, plan repeat Colon, Carlean Purl,  nml repeat 2027  Vaccines:  UptoDate. DEXA: nml 12/2011 repeat in 5 years Hep C:  neg HIV: refused.   Marland Kitchen

## 2016-04-19 NOTE — Patient Instructions (Addendum)
Follow up BP at home, call if > 140/90.  Stop at X-ray on way out.  Work on low fat, low chol diet ( decrease cheese, cream in diet), work on weight loss, keep up exercise.

## 2016-06-18 ENCOUNTER — Encounter: Payer: Self-pay | Admitting: Emergency Medicine

## 2016-06-18 ENCOUNTER — Ambulatory Visit
Admission: EM | Admit: 2016-06-18 | Discharge: 2016-06-18 | Disposition: A | Discharge status: Home / Self Care | Payer: BC Managed Care – PPO | Attending: Family Medicine | Admitting: Family Medicine

## 2016-06-18 DIAGNOSIS — B349 Viral infection, unspecified: Secondary | ICD-10-CM

## 2016-06-18 DIAGNOSIS — R6889 Other general symptoms and signs: Secondary | ICD-10-CM | POA: Diagnosis not present

## 2016-06-18 DIAGNOSIS — R69 Illness, unspecified: Secondary | ICD-10-CM

## 2016-06-18 DIAGNOSIS — J111 Influenza due to unidentified influenza virus with other respiratory manifestations: Secondary | ICD-10-CM

## 2016-06-18 MED ORDER — FEXOFENADINE-PSEUDOEPHED ER 180-240 MG PO TB24: 1.0000 | ORAL_TABLET | Freq: Every day | ORAL | 0 refills | Status: DC

## 2016-06-18 MED ORDER — BENZONATATE 200 MG PO CAPS: 200.0000 mg | ORAL_CAPSULE | Freq: Three times a day (TID) | ORAL | 0 refills | Status: DC | PRN

## 2016-06-18 MED ORDER — OSELTAMIVIR PHOSPHATE 75 MG PO CAPS: 75.0000 mg | ORAL_CAPSULE | Freq: Two times a day (BID) | ORAL | 0 refills | Status: DC

## 2016-06-18 MED ORDER — MELOXICAM 15 MG PO TABS: 15.0000 mg | ORAL_TABLET | Freq: Every day | ORAL | 0 refills | Status: DC

## 2016-06-18 NOTE — ED Notes (Signed)
Patient was in a hurry to leave and refused to have her BP and pulse rechecked before she left.

## 2016-06-18 NOTE — ED Triage Notes (Signed)
Patient c/o cough, congestion, fever, and bodyaches that started last night.

## 2016-06-18 NOTE — ED Provider Notes (Signed)
[facility]    CSN: [id number] Arrival date & time: 06/18/16  [place]     History   Chief Complaint Chief Complaint  Patient presents with  . Cough  . Fever  . Generalized Body Aches    HPI Elizabeth Lowery is a 61 y.o. female.   Patient's here because of symptoms of flu. Everything started last night as she was driving home from the beach. She states just feeling achy all over coughing I started bothering her and irritated throat. She felt worse today during a fever this evening upon 100 she came in to be seen and evaluated this is been going on   under 24 hours. Her nephew is a physician in [place] and recommend she come in to be seen in to be evaluated for the flu. Have explained to her that her: Policy would not testing for the flu illness we have a clear-cut special medication. Also treat her that if clinically ref physically she meets the criteria for flu we'll go ahead and treat her for the flu. 3 surgeries no operations no medical problems other than hyperlipidemia obesity. She's had some orthopedic issues. No known drug allergies. Habits she does not smoke. No pertinent family medical history relevant to today's visit   The history is provided by the patient. No language interpreter was used.  Cough  Cough characteristics:  Non-productive Severity:  Moderate Onset quality:  Sudden Timing:  Constant Progression:  Worsening Chronicity:  New Smoker: no   Context: upper respiratory infection   Relieved by:  Nothing Worsened by:  Nothing Associated symptoms: fever, rhinorrhea and sore throat   Associated symptoms: no wheezing   Risk factors: no recent infection   Fever  Associated symptoms: cough, rhinorrhea and sore throat   Associated symptoms: no congestion     Past Medical History:  Diagnosis Date  . Arthritis    osteoarthritis  . Hyperlipidemia   . Menopause   . Morbid obesity ([place])   . PONV (postoperative nausea and vomiting)   . RUQ pain 2012     ruq pain with fatty lover of u/s, no gallstones and normal HIDA scan  . Seasonal allergies   . Sinus problem     Patient Active Problem List   Diagnosis Date Noted  . Chronic pain of left knee 04/19/2016  . Elevated blood pressure reading without diagnosis of hypertension 04/19/2016  . Obesity BMI > 30 04/21/2015  . History of laparoscopic adjustable gastric banding, 04/24/2011. 05/10/2011  . Fatty liver 12/01/2010  . Hyperlipidemia 08/06/2008    Past Surgical History:  Procedure Laterality Date  . JOINT REPLACEMENT  2010, 2011   right on 12/2008, left on 07/2009  . LAPAROSCOPIC GASTRIC BANDING  04/24/2011   Procedure: LAPAROSCOPIC GASTRIC BANDING;  Surgeon: Shann Medal, MD;  Location: [facility];  Service: General;  Laterality: N/A;  with Mesh under port  . TOTAL HIP ARTHROPLASTY  2010, 2012   Bilateral  . TUBAL LIGATION      OB History    No data available       Home Medications    Prior to Admission medications   Medication Sig Start Date End Date Taking? Authorizing Provider  acetaminophen (TYLENOL) 500 MG tablet Take 500 mg by mouth every 6 (six) hours as needed.    Historical Provider, MD  benzonatate (TESSALON) 200 MG capsule Take 1 capsule (200 mg total) by mouth 3 (three) times daily as needed. 06/18/16   Frederich Cha, MD  BIOTIN PO Take 1,000 mcg by mouth daily.    Historical Provider, MD  bisacodyl (BISACODYL) 5 MG EC tablet Take 5 mg by mouth once. One time use for colonoscopy    Historical Provider, MD  Calcium Carbonate (CALCIUM 500 PO) Take 3 tablets by mouth daily.    Historical Provider, MD  cetirizine (ZYRTEC) 10 MG tablet Take 10 mg by mouth daily as needed for allergies. Reported on 06/17/2015    Historical Provider, MD  cholecalciferol (VITAMIN D) 1000 UNITS tablet Take 2,000 Units by mouth daily.     Historical Provider, MD  fexofenadine-pseudoephedrine (ALLEGRA-D ALLERGY & CONGESTION) 180-240 MG 24 hr tablet Take 1 tablet by mouth daily. 06/18/16   Frederich Cha, MD  Melatonin 3 MG TABS Take 2 tablets by mouth at bedtime as needed. Reported on 06/17/2015    Historical Provider, MD  meloxicam (MOBIC) 15 MG tablet Take 1 tablet (15 mg total) by mouth daily. 06/18/16   Frederich Cha, MD  Multiple Vitamin (MULTIVITAMIN) tablet Take 1 tablet by mouth daily.     Historical Provider, MD  oseltamivir (TAMIFLU) 75 MG capsule Take 1 capsule (75 mg total) by mouth 2 (two) times daily. 06/18/16   Frederich Cha, MD    Family History Family History  Problem Relation Age of Onset  . Lymphoma Father   . Lung cancer Father   . Cancer Father 47    lymphoma, lung  . Osteoarthritis Father   . Hyperlipidemia Mother   . Osteoporosis Mother   . Cancer Mother   . Other Mother     spinal stenosis  . Diabetes Brother   . Other Brother     morbid obesity, lung issues  . Osteoarthritis Brother   . Sleep apnea Brother   . Osteoarthritis Sister   . Hyperlipidemia Sister   . Hypertension Sister   . Other Sister     morbid obesity  . Colon cancer Maternal Grandmother   . Cancer Maternal Grandmother     colon  . Coronary artery disease Paternal Grandmother     Social History Social History  Substance Use Topics  . Smoking status: Never Smoker  . Smokeless tobacco: Never Used  . Alcohol use 0.6 oz/week    1 Glasses of wine per week     Comment: 1-2 wine on weekends     Allergies   Patient has no known allergies.   Review of Systems Review of Systems  Constitutional: Positive for fever.  HENT: Positive for rhinorrhea, sinus pain, sneezing and sore throat. Negative for congestion and dental problem.   Respiratory: Positive for cough. Negative for wheezing.   All other systems reviewed and are negative.    Physical Exam Triage Vital Signs ED Triage Vitals  Enc Vitals Group     BP 06/18/16 1931 (!) 152/96     Pulse Rate 06/18/16 1931 (!) 120     Resp 06/18/16 1931 16     Temp 06/18/16 1931 98.9 F (37.2 C)     Temp Source 06/18/16 1931 Oral      SpO2 06/18/16 1931 100 %     Weight 06/18/16 1930 180 lb (81.6 kg)     Height 06/18/16 1930 5\' 4"  (1.626 m)     Head Circumference --      Peak Flow --      Pain Score 06/18/16 1931 3     Pain Loc --      Pain Edu? --      Excl.  in [place]? --    No data found.   Updated Vital Signs BP (!) 152/96 (BP Location: Right Arm)   Pulse (!) 120   Temp 98.9 F (37.2 C) (Oral)   Resp 16   Ht 5\' 4"  (1.626 m)   Wt 180 lb (81.6 kg)   SpO2 100%   BMI 30.90 kg/m   Visual Acuity Right Eye Distance:   Left Eye Distance:   Bilateral Distance:    Right Eye Near:   Left Eye Near:    Bilateral Near:     Physical Exam  Constitutional: She appears well-developed and well-nourished.  HENT:  Head: Normocephalic and atraumatic.  Right Ear: Hearing, tympanic membrane, external ear and ear canal normal.  Left Ear: Hearing, tympanic membrane, external ear and ear canal normal.  Nose: Mucosal edema present.  Mouth/Throat: Uvula is midline. Posterior oropharyngeal erythema present.  Eyes: EOM are normal. Pupils are equal, round, and reactive to light.  Neck: Normal range of motion. Neck supple.  Cardiovascular: Normal rate.   Pulmonary/Chest: Effort normal and breath sounds normal.  Musculoskeletal: Normal range of motion.  Neurological: She is alert.  Skin: Skin is warm.  Psychiatric: She has a normal mood and affect.  Vitals reviewed.    UC Treatments / Results  Labs (all labs ordered are listed, but only abnormal results are displayed) Labs Reviewed - No data to display  EKG  EKG Interpretation None       Radiology No results found.  Procedures Procedures (including critical care time)  Medications Ordered in UC Medications - No data to display   Initial Impression / Assessment and Plan / UC Course  I have reviewed the triage vital signs and the nursing notes.  Pertinent labs & imaging results that were available during my care of the patient were reviewed by me and  considered in my medical decision making (see chart for details).     Patient has a presence of influenza. Will to Tamiflu Allegra-D Tessalon Perles M "pain. Will give a work note for today and tomorrow. Follow-up PCP if not better in a week  Final Clinical Impressions(s) / UC Diagnoses   Final diagnoses:  Influenza-like illness  Viral illness  Flu-like symptoms    New Prescriptions New Prescriptions   BENZONATATE (TESSALON) 200 MG CAPSULE    Take 1 capsule (200 mg total) by mouth 3 (three) times daily as needed.   FEXOFENADINE-PSEUDOEPHEDRINE (ALLEGRA-D ALLERGY & CONGESTION) 180-240 MG 24 HR TABLET    Take 1 tablet by mouth daily.   MELOXICAM (MOBIC) 15 MG TABLET    Take 1 tablet (15 mg total) by mouth daily.   OSELTAMIVIR (TAMIFLU) 75 MG CAPSULE    Take 1 capsule (75 mg total) by mouth 2 (two) times daily.    Note: This dictation was prepared with Dragon dictation along with smaller phrase technology. Any transcriptional errors that result from this process are unintentional.   Frederich Cha, MD 06/18/16 2023

## 2016-08-11 ENCOUNTER — Telehealth: Payer: BC Managed Care – PPO | Admitting: Nurse Practitioner

## 2016-08-11 DIAGNOSIS — J0101 Acute recurrent maxillary sinusitis: Secondary | ICD-10-CM

## 2016-08-11 MED ORDER — AZITHROMYCIN 250 MG PO TABS: ORAL_TABLET | ORAL | 0 refills | Status: DC

## 2016-08-11 NOTE — Progress Notes (Signed)

## 2016-08-16 ENCOUNTER — Encounter: Payer: Self-pay | Admitting: Family Medicine

## 2016-08-16 ENCOUNTER — Ambulatory Visit (INDEPENDENT_AMBULATORY_CARE_PROVIDER_SITE_OTHER): Payer: BC Managed Care – PPO | Admitting: Family Medicine

## 2016-08-16 VITALS — BP 142/90 | HR 66 | Temp 98.2°F | Wt 193.6 lb

## 2016-08-16 DIAGNOSIS — R030 Elevated blood-pressure reading, without diagnosis of hypertension: Secondary | ICD-10-CM | POA: Diagnosis not present

## 2016-08-16 NOTE — Progress Notes (Signed)
Subjective:    Patient ID: Elizabeth Lowery, female    DOB: Oct 12, 1955, 61 y.o.   MRN: [id number]  HPI  Elizabeth Lowery is a 61 year old female who with a concern of her blood pressure.  I am seeing her in place of her PCP today who is unavailable. She has a history of elevated BP without HTN. She describes feeling warm at work today.  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  Currently, she denies having any chest pain, palpitations, numbness, tingling, weakness, dizziness, lightheadedness or headaches.Due to being in a school location, EMS was called and they noted a BP of 150/80 and blood sugar was 119.  She states that she is feeling better and at this time. She reports history of sinusitis with congestion, sinus pressure, and post nasal drip where she completed an E-visit and she was prescribed azithromycin and Mucinex.  She completed Z-pack this morning.  She has an appointment next week with her PCP to discuss BP monitoring. She reports monitoring her BP at home and notes readings of 147/89.  She reports averages in the 140s and high 80s. She exercises walking 3 days/week for 40 minutes and is weight training twice/week without cardiopulmonary symptoms.  Review of Systems  Constitutional: Negative for chills, fatigue and fever.  HENT: Positive for congestion, postnasal drip, sinus pain and sinus pressure.   Eyes: Negative for visual disturbance.  Respiratory: Negative for cough, shortness of breath and wheezing.   Cardiovascular: Negative for chest pain and palpitations.  Gastrointestinal: Negative for abdominal pain, diarrhea, nausea and vomiting.  Genitourinary: Negative for dysuria and urgency.  Musculoskeletal: Negative for myalgias.  Neurological: Negative for dizziness, weakness, light-headedness, numbness and headaches.   Past Medical History:  Diagnosis Date  . Arthritis    osteoarthritis  . Hyperlipidemia    . Menopause   . Morbid obesity ([place])   . PONV (postoperative nausea and vomiting)   . RUQ pain 2012   ruq pain with fatty lover of u/s, no gallstones and normal HIDA scan  . Seasonal allergies   . Sinus problem      Social History   Social History  . Marital status: Divorced    Spouse name: N/A  . Number of children: 1  . Years of education: N/A   Occupational History  . 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 Smoking status: Never Smoker  . Smokeless tobacco: Never Used  . Alcohol use 0.6 oz/week    1 Glasses of wine per week     Comment: 1-2 wine on weekends  . Drug use: No  . Sexual activity: Not on file   Other Topics Concern  . Not on file   Social History Narrative   Regular exercise: yes, but more limited due to heel pain   Diet:ruits and veggies    Past Surgical History:  Procedure Laterality Date  . JOINT REPLACEMENT  2010, 2011   right on 12/2008, left on 07/2009  . LAPAROSCOPIC GASTRIC BANDING  04/24/2011   Procedure: LAPAROSCOPIC GASTRIC BANDING;  Surgeon: Shann Medal, MD;  Location: [facility];  Service: General;  Laterality: N/A;  with Mesh under port  . TOTAL HIP ARTHROPLASTY  2010, 2012   Bilateral  . TUBAL LIGATION      Family History  Problem Relation Age of  Onset  . Lymphoma Father   . Lung cancer Father   . Cancer Father 34    lymphoma, lung  . Osteoarthritis Father   . Hyperlipidemia Mother   . Osteoporosis Mother   . Cancer Mother   . Other Mother     spinal stenosis  . Diabetes Brother   . Other Brother     morbid obesity, lung issues  . Osteoarthritis Brother   . Sleep apnea Brother   . Osteoarthritis Sister   . Hyperlipidemia Sister   . Hypertension Sister   . Other Sister     morbid obesity  . Colon cancer Maternal Grandmother   . Cancer Maternal Grandmother     colon  . Coronary artery disease Paternal Grandmother     No Known Allergies  Current Outpatient  Prescriptions on File Prior to Visit  Medication Sig Dispense Refill  . acetaminophen (TYLENOL) 500 MG tablet Take 500 mg by mouth every 6 (six) hours as needed.    Marland Kitchen azithromycin (ZITHROMAX Z-PAK) 250 MG tablet As directed 6 tablet 0  . benzonatate (TESSALON) 200 MG capsule Take 1 capsule (200 mg total) by mouth 3 (three) times daily as needed. 30 capsule 0  . BIOTIN PO Take 1,000 mcg by mouth daily.    . bisacodyl (BISACODYL) 5 MG EC tablet Take 5 mg by mouth once. One time use for colonoscopy    . Calcium Carbonate (CALCIUM 500 PO) Take 3 tablets by mouth daily.    . cetirizine (ZYRTEC) 10 MG tablet Take 10 mg by mouth daily as needed for allergies. Reported on 06/17/2015    . cholecalciferol (VITAMIN D) 1000 UNITS tablet Take 2,000 Units by mouth daily.     . fexofenadine-pseudoephedrine (ALLEGRA-D ALLERGY & CONGESTION) 180-240 MG 24 hr tablet Take 1 tablet by mouth daily. 30 tablet 0  . Melatonin 3 MG TABS Take 2 tablets by mouth at bedtime as needed. Reported on 06/17/2015    . meloxicam (MOBIC) 15 MG tablet Take 1 tablet (15 mg total) by mouth daily. 30 tablet 0  . Multiple Vitamin (MULTIVITAMIN) tablet Take 1 tablet by mouth daily.     Marland Kitchen oseltamivir (TAMIFLU) 75 MG capsule Take 1 capsule (75 mg total) by mouth 2 (two) times daily. 10 capsule 0   No current facility-administered medications on file prior to visit.     BP (!) 142/90   Pulse 66   Temp 98.2 F (36.8 C) (Oral)   Wt 193 lb 9.6 oz (87.8 kg)   SpO2 97%   BMI 33.23 kg/m       Objective:   Physical Exam  Constitutional: She is oriented to person, place, and time. She appears well-developed and well-nourished.  HENT:  Right Ear: Tympanic membrane normal.  Left Ear: Tympanic membrane normal.  Nose: Rhinorrhea present. Right sinus exhibits no maxillary sinus tenderness and no frontal sinus tenderness. Left sinus exhibits no maxillary sinus tenderness and no frontal sinus tenderness.  Mouth/Throat: Mucous membranes are  normal. No oropharyngeal exudate or posterior oropharyngeal erythema.  Eyes: Pupils are equal, round, and reactive to light. No scleral icterus.  Neck: Neck supple.  Cardiovascular: Normal rate, regular rhythm and intact distal pulses.   Pulmonary/Chest: Effort normal and breath sounds normal. She has no wheezes. She has no rales.  Abdominal: Soft. Bowel sounds are normal. There is no tenderness.  Lymphadenopathy:    She has no cervical adenopathy.  Neurological: She is alert and oriented to person, place, and time.  Skin: Skin is warm and dry. No rash noted.      Assessment & Plan:  1. Elevated BP without diagnosis of hypertension History of elevated BP that is borderline. Exam and history are reassuring today.  Retake of BP 142/90. Advised continued monitoring of BP this week and asked her to bring those readings with her to her follow up visit with her provider. We discussed the importance of healthy diet, exercise, and avoidance of salt. DASH diet recommendations provided for her.   Follow up next week with PCP or sooner if needed.   Delano Metz, FNP-C

## 2016-08-16 NOTE — Patient Instructions (Addendum)
It was a pleasure to meet you today. Please continue to monitor your BP and follow up with your provider next week with readings as we discussed.  It is important to report any readings >140/90 to your provider. Continue with exercise and low salt diet.   DASH Eating Plan DASH stands for "Dietary Approaches to Stop Hypertension." The DASH eating plan is a healthy eating plan that has been shown to reduce high blood pressure (hypertension). It may also reduce your risk for type 2 diabetes, heart disease, and stroke. The DASH eating plan may also help with weight loss. What are tips for following this plan? General guidelines   Avoid eating more than 2,300 mg (milligrams) of salt (sodium) a day. If you have hypertension, you may need to reduce your sodium intake to 1,500 mg a day.  Limit alcohol intake to no more than 1 drink a day for nonpregnant women and 2 drinks a day for men. One drink equals 12 oz of beer, 5 oz of wine, or 1 oz of hard liquor.  Work with your health care provider to maintain a healthy body weight or to lose weight. Ask what an ideal weight is for you.  Get at least 30 minutes of exercise that causes your heart to beat faster (aerobic exercise) most days of the week. Activities may include walking, swimming, or biking.  Work with your health care provider or diet and nutrition specialist (dietitian) to adjust your eating plan to your individual calorie needs. Reading food labels   Check food labels for the amount of sodium per serving. Choose foods with less than 5 percent of the Daily Value of sodium. Generally, foods with less than 300 mg of sodium per serving fit into this eating plan.  To find whole grains, look for the word "whole" as the first word in the ingredient list. Shopping   Buy products labeled as "low-sodium" or "no salt added."  Buy fresh foods. Avoid canned foods and premade or frozen meals. Cooking   Avoid adding salt when cooking. Use salt-free  seasonings or herbs instead of table salt or sea salt. Check with your health care provider or pharmacist before using salt substitutes.  Do not fry foods. Cook foods using healthy methods such as baking, boiling, grilling, and broiling instead.  Cook with heart-healthy oils, such as olive, canola, soybean, or sunflower oil. Meal planning    Eat a balanced diet that includes:  5 or more servings of fruits and vegetables each day. At each meal, try to fill half of your plate with fruits and vegetables.  Up to 6-8 servings of whole grains each day.  Less than 6 oz of lean meat, poultry, or fish each day. A 3-oz serving of meat is about the same size as a deck of cards. One egg equals 1 oz.  2 servings of low-fat dairy each day.  A serving of nuts, seeds, or beans 5 times each week.  Heart-healthy fats. Healthy fats called Omega-3 fatty acids are found in foods such as flaxseeds and coldwater fish, like sardines, salmon, and mackerel.  Limit how much you eat of the following:  Canned or prepackaged foods.  Food that is high in trans fat, such as fried foods.  Food that is high in saturated fat, such as fatty meat.  Sweets, desserts, sugary drinks, and other foods with added sugar.  Full-fat dairy products.  Do not salt foods before eating.  Try to eat at least 2 vegetarian meals  each week.  Eat more home-cooked food and less restaurant, buffet, and fast food.  When eating at a restaurant, ask that your food be prepared with less salt or no salt, if possible. What foods are recommended? The items listed may not be a complete list. Talk with your dietitian about what dietary choices are best for you. Grains  Whole-grain or whole-wheat bread. Whole-grain or whole-wheat pasta. Brown rice. [place]. Bulgur. Whole-grain and low-sodium cereals. Pita bread. Low-fat, low-sodium crackers. Whole-wheat flour tortillas. Vegetables  Fresh or frozen vegetables (raw, steamed,  roasted, or grilled). Low-sodium or reduced-sodium tomato and vegetable juice. Low-sodium or reduced-sodium tomato sauce and tomato paste. Low-sodium or reduced-sodium canned vegetables. Fruits  All fresh, dried, or frozen fruit. Canned fruit in natural juice (without added sugar). Meat and other protein foods  Skinless chicken or [place]. Ground chicken or [place]. Pork with fat trimmed off. Fish and seafood. Egg whites. Dried beans, peas, or lentils. Unsalted nuts, nut butters, and seeds. Unsalted canned beans. Lean cuts of beef with fat trimmed off. Low-sodium, lean deli meat. Dairy  Low-fat (1%) or fat-free (skim) milk. Fat-free, low-fat, or reduced-fat cheeses. Nonfat, low-sodium ricotta or cottage cheese. Low-fat or nonfat yogurt. Low-fat, low-sodium cheese. Fats and oils  Soft margarine without trans fats. Vegetable oil. Low-fat, reduced-fat, or light mayonnaise and salad dressings (reduced-sodium). Canola, safflower, olive, soybean, and sunflower oils. Avocado. Seasoning and other foods  Herbs. Spices. Seasoning mixes without salt. Unsalted popcorn and pretzels. Fat-free sweets. What foods are not recommended? The items listed may not be a complete list. Talk with your dietitian about what dietary choices are best for you. Grains  Baked goods made with fat, such as croissants, muffins, or some breads. Dry pasta or rice meal packs. Vegetables  Creamed or fried vegetables. Vegetables in a cheese sauce. Regular canned vegetables (not low-sodium or reduced-sodium). Regular canned tomato sauce and paste (not low-sodium or reduced-sodium). Regular tomato and vegetable juice (not low-sodium or reduced-sodium). Angie Fava. Olives. Fruits  Canned fruit in a light or heavy syrup. Fried fruit. Fruit in cream or butter sauce. Meat and other protein foods  Fatty cuts of meat. Ribs. Fried meat. Berniece Salines. Sausage. Bologna and other processed lunch meats. Salami. Fatback. Hotdogs. Bratwurst. Salted nuts and  seeds. Canned beans with added salt. Canned or smoked fish. Whole eggs or egg yolks. Chicken or [place] with skin. Dairy  Whole or 2% milk, cream, and half-and-half. Whole or full-fat cream cheese. Whole-fat or sweetened yogurt. Full-fat cheese. Nondairy creamers. Whipped toppings. Processed cheese and cheese spreads. Fats and oils  Butter. Stick margarine. Lard. Shortening. Ghee. Bacon fat. Tropical oils, such as coconut, palm kernel, or palm oil. Seasoning and other foods  Salted popcorn and pretzels. Onion salt, garlic salt, seasoned salt, table salt, and sea salt. Worcestershire sauce. Tartar sauce. Barbecue sauce. Teriyaki sauce. Soy sauce, including reduced-sodium. Steak sauce. Canned and packaged gravies. Fish sauce. Oyster sauce. Cocktail sauce. Horseradish that you find on the shelf. Ketchup. Mustard. Meat flavorings and tenderizers. Bouillon cubes. Hot sauce and Tabasco sauce. Premade or packaged marinades. Premade or packaged taco seasonings. Relishes. Regular salad dressings. Where to find more information:  National Heart, Lung, and [place]: https://wilson-eaton.com/  American Heart Association: www.heart.org Summary  The DASH eating plan is a healthy eating plan that has been shown to reduce high blood pressure (hypertension). It may also reduce your risk for type 2 diabetes, heart disease, and stroke.  With the DASH eating plan, you should limit salt (sodium) intake  to 2,300 mg a day. If you have hypertension, you may need to reduce your sodium intake to 1,500 mg a day.  When on the DASH eating plan, aim to eat more fresh fruits and vegetables, whole grains, lean proteins, low-fat dairy, and heart-healthy fats.  Work with your health care provider or diet and nutrition specialist (dietitian) to adjust your eating plan to your individual calorie needs. This information is not intended to replace advice given to you by your health care provider. Make sure you discuss any questions  you have with your health care provider. Document Released: 04/26/2011 Document Revised: 04/30/2016 Document Reviewed: 04/30/2016 Elsevier Interactive Patient Education  2017 [place] NOW OFFER   [facility] FAST TRACK!!!  SAME DAY Appointments for ACUTE CARE  Such as: Sprains, Injuries, cuts, abrasions, rashes, muscle pain, joint pain, back pain Colds, flu, sore throats, headache, allergies, cough, fever  Ear pain, sinus and eye infections Abdominal pain, nausea, vomiting, diarrhea, upset stomach Animal/insect bites  3 Easy Ways to Schedule: Walk-In Scheduling Call in scheduling Mychart Sign-up: [email or web address]

## 2016-08-16 NOTE — Progress Notes (Signed)
Pre visit review using our clinic review tool, if applicable. No additional management support is needed unless otherwise documented below in the visit note. 

## 2016-08-24 ENCOUNTER — Ambulatory Visit: Payer: Self-pay | Admitting: Family Medicine

## 2016-08-24 ENCOUNTER — Ambulatory Visit (INDEPENDENT_AMBULATORY_CARE_PROVIDER_SITE_OTHER): Payer: BC Managed Care – PPO | Admitting: Family Medicine

## 2016-08-24 ENCOUNTER — Encounter: Payer: Self-pay | Admitting: Family Medicine

## 2016-08-24 VITALS — BP 120/80 | HR 67 | Temp 97.5°F | Ht 63.5 in | Wt 195.5 lb

## 2016-08-24 DIAGNOSIS — I10 Essential (primary) hypertension: Secondary | ICD-10-CM | POA: Diagnosis not present

## 2016-08-24 DIAGNOSIS — R0982 Postnasal drip: Secondary | ICD-10-CM

## 2016-08-24 MED ORDER — HYDROCHLOROTHIAZIDE 25 MG PO TABS: 25.0000 mg | ORAL_TABLET | Freq: Every day | ORAL | 11 refills | Status: DC

## 2016-08-24 NOTE — Progress Notes (Signed)
   Subjective:    Patient ID: Elizabeth Lowery, female    DOB: 1955-10-03, 61 y.o.   MRN: [id number]  HPI   61 year old female presents with multiple complaints.  Hypertension:    Good control today in office  BP Readings from Last 3 Encounters:  08/24/16 120/80  08/16/16 (!) 142/90  06/18/16 (!) 152/96  Using medication without problems or lightheadedness:  none Chest pain with exertion:none Edema:none Short of breath:none Average home BPs: 131-153/83-95 Other issues:  Wt Readings from Last 3 Encounters:  08/24/16 195 lb 8 oz (88.7 kg)  08/16/16 193 lb 9.6 oz (87.8 kg)  06/18/16 180 lb (81.6 kg)      In last March  ..she has nasal discharge, cough , congestion, swollen glands, no ST.  Had e-visit.. Treated with Z-pack 3/24 Had taken mucinex.  She felt lightheaded.. BP was 150/80. She was seen at [facility].  Told to decrease slat   She has nasal congestion.. mucus is white or clear. Neck sore.  Has post nasal drip. No ear pain, no sinus pain.  She is using flonase, diffuser and nasal saline.     Review of Systems  Constitutional: Positive for fatigue.  Eyes: Negative for pain.  Respiratory: Negative for shortness of breath.   Cardiovascular: Negative for chest pain.  Gastrointestinal: Negative for abdominal distention.       Objective:   Physical Exam  Constitutional: Vital signs are normal. She appears well-developed and well-nourished. She is cooperative.  Non-toxic appearance. She does not appear ill. No distress.  HENT:  Head: Normocephalic.  Right Ear: Hearing, tympanic membrane, external ear and ear canal normal. Tympanic membrane is not erythematous, not retracted and not bulging.  Left Ear: Hearing, tympanic membrane, external ear and ear canal normal. Tympanic membrane is not erythematous, not retracted and not bulging.  Nose: No rhinorrhea. Right sinus exhibits no maxillary sinus tenderness and no frontal sinus tenderness. Left sinus exhibits no  maxillary sinus tenderness and no frontal sinus tenderness.  Mouth/Throat: Uvula is midline, oropharynx is clear and moist and mucous membranes are normal.  Eyes: Conjunctivae, EOM and lids are normal. Pupils are equal, round, and reactive to light. Lids are everted and swept, no foreign bodies found.  Neck: Trachea normal and normal range of motion. Neck supple. Carotid bruit is not present. No thyroid mass and no thyromegaly present.  Cardiovascular: Normal rate, regular rhythm, S1 normal, S2 normal, normal heart sounds, intact distal pulses and normal pulses.  Exam reveals no gallop and no friction rub.   No murmur heard. Pulmonary/Chest: Effort normal and breath sounds normal. No tachypnea. No respiratory distress. She has no decreased breath sounds. She has no wheezes. She has no rhonchi. She has no rales.  Abdominal: Soft. Normal appearance and bowel sounds are normal. There is no tenderness.  Neurological: She is alert.  Skin: Skin is warm, dry and intact. No rash noted.  Psychiatric: Her speech is normal and behavior is normal. Judgment and thought content normal. Her mood appears not anxious. Cognition and memory are normal. She does not exhibit a depressed mood.          Assessment & Plan:

## 2016-08-24 NOTE — Progress Notes (Signed)
Pre visit review using our clinic review tool, if applicable. No additional management support is needed unless otherwise documented below in the visit note. 

## 2016-08-24 NOTE — Assessment & Plan Note (Signed)
Start HCTZ daily. Encouraged exercise, weight loss, healthy eating habits. Goal wt loss initially 10 % current weight. Low Na, high potassium.

## 2016-08-24 NOTE — Patient Instructions (Signed)
Increase flonase to 2 sprays per nostril daily. Start Xyzal/zyrtec OTC at bedtime.   Start HCTZ daily in AM.  Follow BP at home, Goal < 140/90 consistently.  Call If lightheaded or BP < 90/60.

## 2016-08-24 NOTE — Assessment & Plan Note (Signed)
Treat allergies with antihistamine, avoidance and nasal steroid spray.

## 2016-12-29 ENCOUNTER — Telehealth: Payer: BC Managed Care – PPO | Admitting: Nurse Practitioner

## 2016-12-29 DIAGNOSIS — N3000 Acute cystitis without hematuria: Secondary | ICD-10-CM

## 2016-12-29 MED ORDER — CIPROFLOXACIN HCL 500 MG PO TABS: 500.0000 mg | ORAL_TABLET | Freq: Two times a day (BID) | ORAL | 0 refills | Status: DC

## 2016-12-29 NOTE — Progress Notes (Signed)

## 2017-04-29 ENCOUNTER — Other Ambulatory Visit: Payer: BC Managed Care – PPO

## 2017-05-02 ENCOUNTER — Encounter: Payer: BC Managed Care – PPO | Admitting: Family Medicine

## 2017-05-07 ENCOUNTER — Encounter: Payer: Self-pay | Admitting: Family Medicine

## 2017-06-11 ENCOUNTER — Telehealth: Payer: Self-pay | Admitting: Family Medicine

## 2017-06-11 ENCOUNTER — Other Ambulatory Visit (INDEPENDENT_AMBULATORY_CARE_PROVIDER_SITE_OTHER): Payer: BC Managed Care – PPO

## 2017-06-11 DIAGNOSIS — E782 Mixed hyperlipidemia: Secondary | ICD-10-CM

## 2017-06-11 LAB — COMPREHENSIVE METABOLIC PANEL
ALK PHOS: 58 U/L (ref 39–117)
ALT: 18 U/L (ref 0–35)
AST: 18 U/L (ref 0–37)
Albumin: 4 g/dL (ref 3.5–5.2)
BILIRUBIN TOTAL: 0.3 mg/dL (ref 0.2–1.2)
BUN: 16 mg/dL (ref 6–23)
CALCIUM: 9.3 mg/dL (ref 8.4–10.5)
CO2: 30 mEq/L (ref 19–32)
Chloride: 108 mEq/L (ref 96–112)
Creatinine, Ser: 0.83 mg/dL (ref 0.40–1.20)
GFR: 73.68 mL/min (ref >60.00)
Glucose, Bld: 86 mg/dL (ref 70–99)
POTASSIUM: 4.3 meq/L (ref 3.5–5.1)
Sodium: 142 mEq/L (ref 135–145)
TOTAL PROTEIN: 6.6 g/dL (ref 6.0–8.3)

## 2017-06-11 LAB — LIPID PANEL
Cholesterol: 207 mg/dL — ABNORMAL HIGH (ref 0–200)
HDL: 53.8 mg/dL (ref >39.00)
LDL Cholesterol: 141 mg/dL — ABNORMAL HIGH (ref 0–99)
NonHDL: 153.55
TRIGLYCERIDES: 61 mg/dL (ref 0.0–149.0)
Total CHOL/HDL Ratio: 4
VLDL: 12.2 mg/dL (ref 0.0–40.0)

## 2017-06-11 NOTE — Telephone Encounter (Signed)
-----   Message from Ellamae Sia sent at 06/03/2017  2:35 PM EST ----- Regarding: Lab orders for Tuesday, 1.22.19 Patient is scheduled for CPX labs, please order future labs, Thanks , Karna Christmas

## 2017-06-18 ENCOUNTER — Other Ambulatory Visit: Payer: Self-pay

## 2017-06-18 ENCOUNTER — Encounter: Payer: Self-pay | Admitting: Family Medicine

## 2017-06-18 ENCOUNTER — Ambulatory Visit (INDEPENDENT_AMBULATORY_CARE_PROVIDER_SITE_OTHER): Payer: BC Managed Care – PPO | Admitting: Family Medicine

## 2017-06-18 VITALS — BP 118/80 | HR 71 | Temp 98.5°F | Ht 63.75 in | Wt 198.0 lb

## 2017-06-18 DIAGNOSIS — Z0000 Encounter for general adult medical examination without abnormal findings: Secondary | ICD-10-CM

## 2017-06-18 DIAGNOSIS — M1812 Unilateral primary osteoarthritis of first carpometacarpal joint, left hand: Secondary | ICD-10-CM

## 2017-06-18 DIAGNOSIS — N939 Abnormal uterine and vaginal bleeding, unspecified: Secondary | ICD-10-CM | POA: Diagnosis not present

## 2017-06-18 DIAGNOSIS — Z6834 Body mass index (BMI) 34.0-34.9, adult: Secondary | ICD-10-CM | POA: Diagnosis not present

## 2017-06-18 DIAGNOSIS — Z23 Encounter for immunization: Secondary | ICD-10-CM

## 2017-06-18 DIAGNOSIS — E2839 Other primary ovarian failure: Secondary | ICD-10-CM

## 2017-06-18 DIAGNOSIS — Z1231 Encounter for screening mammogram for malignant neoplasm of breast: Secondary | ICD-10-CM | POA: Diagnosis not present

## 2017-06-18 DIAGNOSIS — I10 Essential (primary) hypertension: Secondary | ICD-10-CM

## 2017-06-18 DIAGNOSIS — E6609 Other obesity due to excess calories: Secondary | ICD-10-CM

## 2017-06-18 MED ORDER — MELOXICAM 15 MG PO TABS: 15.0000 mg | ORAL_TABLET | Freq: Every day | ORAL | 0 refills | Status: DC

## 2017-06-18 NOTE — Assessment & Plan Note (Signed)
ONe time episode. Nml manual pelvic exam today.  If recurring will do further eval with [place] and  Full pelvic exam.

## 2017-06-18 NOTE — Progress Notes (Signed)
Subjective:    Patient ID: Elizabeth Lowery, female    DOB: [birth year], 62 y.o.   MRN: [id number]  HPI  The patient is here for annual wellness exam and preventative care.    Elevated Cholesterol:  Inadequate but improving  on no med. 4% --10 year risk per AHA risk calc. Lab Results  Component Value Date   CHOL 207 (H) 06/11/2017   HDL 53.80 06/11/2017   LDLCALC 141 (H) 06/11/2017   LDLDIRECT 131.4 11/26/2011   TRIG 61.0 06/11/2017   CHOLHDL 4 06/11/2017  Using medications without problems: Muscle aches:  Diet compliance: healthy low fat diet Exercise: 4 times a week Other complaints: Wt Readings from Last 3 Encounters:  06/18/17 198 lb (89.8 kg)  08/24/16 195 lb 8 oz (88.7 kg)  08/16/16 193 lb 9.6 oz (87.8 kg)  Body mass index is 34.25 kg/m.  Hx of lap band surgery in 2012   Hypertension:   medication was dropping to low on meds 93/59... Was lightheaded.. Since has come off and BP staying less than 140/90   Using medication without problems or lightheadedness:  none Chest pain with exertion: none Edema:none Short of breath: none Average home BPs: Other issues:    left handed.. New pain in last year at base of left thumb. No numbness, no weakness. Also has ganglion cyst at left lateral wrist.    Blood pressure 118/80, pulse 71, temperature 98.5 F (36.9 C), temperature source Oral, height 5' 3.75" (1.619 m), weight 198 lb (89.8 kg). Social History /Family History/Past Medical History reviewed in detail and updated in EMR if needed.  Review of Systems  Constitutional: Negative for fatigue and fever.  HENT: Negative for congestion.   Eyes: Negative for pain.  Respiratory: Negative for cough and shortness of breath.   Cardiovascular: Negative for chest pain, palpitations and leg swelling.  Gastrointestinal: Negative for abdominal pain.  Genitourinary: Negative for dysuria and vaginal bleeding.  Musculoskeletal: Negative for back pain.  Neurological: Negative for  syncope, light-headedness and headaches.  Psychiatric/Behavioral: Negative for dysphoric mood.       Objective:   Physical Exam  Constitutional: Vital signs are normal. She appears well-developed and well-nourished. She is cooperative.  Non-toxic appearance. She does not appear ill. No distress.  HENT:  Head: Normocephalic.  Right Ear: Hearing, tympanic membrane, external ear and ear canal normal.  Left Ear: Hearing, tympanic membrane, external ear and ear canal normal.  Nose: Nose normal.  Eyes: Conjunctivae, EOM and lids are normal. Pupils are equal, round, and reactive to light. Lids are everted and swept, no foreign bodies found.  Neck: Trachea normal and normal range of motion. Neck supple. Carotid bruit is not present. No thyroid mass and no thyromegaly present.  Cardiovascular: Normal rate, regular rhythm, S1 normal, S2 normal, normal heart sounds and intact distal pulses. Exam reveals no gallop.  No murmur heard. Pulmonary/Chest: Effort normal and breath sounds normal. No respiratory distress. She has no wheezes. She has no rhonchi. She has no rales.  Abdominal: Soft. Normal appearance and bowel sounds are normal. She exhibits no distension, no fluid wave, no abdominal bruit and no mass. There is no hepatosplenomegaly. There is no tenderness. There is no rebound, no guarding and no CVA tenderness. No hernia.  Genitourinary: Uterus normal. Right adnexum displays no mass and no tenderness. Left adnexum displays no mass and no tenderness.  Musculoskeletal:  ttp and no redness at base of left CMC joint. Positive grind test.  Lymphadenopathy:  She has no cervical adenopathy.    She has no axillary adenopathy.  Neurological: She is alert. She has normal strength. No cranial nerve deficit or sensory deficit.  Skin: Skin is warm, dry and intact. No rash noted.  Psychiatric: Her speech is normal and behavior is normal. Judgment normal. Her mood appears not anxious. Cognition and memory  are normal. She does not exhibit a depressed mood.     Body mass index is 34.25 kg/m.      Assessment & Plan:  The patient's preventative maintenance and recommended screening tests for an annual wellness exam were reviewed in full today. Brought up to date unless services declined.  Counselled on the importance of diet, exercise, and its role in overall health and mortality. The patient's FH and SH was reviewed, including their home life, tobacco status, and drug and alcohol status.   PAP, every 5 years, Last done 2016, DVE done today given... 3 months ago on toilet tissue of bright red blood.. No other symptoms. Mammo 04/11/2015, plan repeat Colon, Carlean Purl,  nml repeat 2027  Vaccines: UptoDate.. Given tdap today. DEXA: nml 12/2011 repeat in 5 years Hep C: neg HIV: refused.

## 2017-06-18 NOTE — Assessment & Plan Note (Signed)
Encouraged exercise, weight loss, healthy eating habits. ? ?

## 2017-06-18 NOTE — Assessment & Plan Note (Signed)
Wear CMC brace.. 2 weeks of meloxicam. If not improving consider referral for steroid injection.

## 2017-06-18 NOTE — Assessment & Plan Note (Signed)
Improved with lifestyle .Marland Kitchen On no med

## 2017-06-18 NOTE — Patient Instructions (Addendum)
Please stop at the front desk to set up referral. Wear CMC brace.. 2 weeks of meloxicam. If not improving consider referral for steroid injection.

## 2017-06-28 ENCOUNTER — Encounter: Payer: Self-pay | Admitting: Family Medicine

## 2017-06-28 LAB — HM DEXA SCAN: HM Dexa Scan: NORMAL

## 2017-06-29 ENCOUNTER — Telehealth: Payer: BC Managed Care – PPO | Admitting: Physician Assistant

## 2017-06-29 DIAGNOSIS — R300 Dysuria: Secondary | ICD-10-CM

## 2017-06-29 MED ORDER — CEPHALEXIN 500 MG PO CAPS: 500.0000 mg | ORAL_CAPSULE | Freq: Two times a day (BID) | ORAL | 0 refills | Status: AC

## 2017-06-29 NOTE — Progress Notes (Signed)

## 2017-07-12 ENCOUNTER — Encounter: Payer: Self-pay | Admitting: Family Medicine

## 2017-09-30 ENCOUNTER — Telehealth: Payer: BC Managed Care – PPO | Admitting: Family Medicine

## 2017-09-30 DIAGNOSIS — L255 Unspecified contact dermatitis due to plants, except food: Secondary | ICD-10-CM

## 2017-09-30 MED ORDER — PREDNISONE 10 MG (21) PO TBPK: ORAL_TABLET | ORAL | 0 refills | Status: DC

## 2017-09-30 MED ORDER — TRIAMCINOLONE ACETONIDE 0.1 % EX CREA: 1.0000 "application " | TOPICAL_CREAM | Freq: Two times a day (BID) | CUTANEOUS | 0 refills | Status: DC

## 2017-09-30 NOTE — Progress Notes (Signed)

## 2017-11-27 ENCOUNTER — Encounter: Payer: Self-pay | Admitting: Family Medicine

## 2018-01-06 ENCOUNTER — Ambulatory Visit: Payer: BC Managed Care – PPO | Admitting: Family Medicine

## 2018-01-06 ENCOUNTER — Encounter: Payer: Self-pay | Admitting: Family Medicine

## 2018-01-06 VITALS — BP 110/72 | HR 71 | Temp 98.3°F | Ht 63.75 in | Wt 207.8 lb

## 2018-01-06 DIAGNOSIS — M1812 Unilateral primary osteoarthritis of first carpometacarpal joint, left hand: Secondary | ICD-10-CM

## 2018-01-06 MED ORDER — METHYLPREDNISOLONE ACETATE 40 MG/ML IJ SUSP
20.0000 mg | Freq: Once | INTRAMUSCULAR | Status: AC
  Administered 2018-01-06: 20 mg via INTRA_ARTICULAR

## 2018-01-06 NOTE — Progress Notes (Signed)
Dr. Frederico Hamman T. Burns Timson, MD, [facility] [place], [address] Phone: [phone] Fax: [phone]  01/06/2018  Patient: Elizabeth Lowery, MRN: [id number], DOB: [birth year], 62 y.o.  Primary Physician:  Jinny Sanders, MD   Chief Complaint  Patient presents with  . Arthritis    CMC Joint-Left Thumb injection   Subjective:   Elizabeth Lowery is a 62 y.o. very pleasant female patient who presents with the following:  L hand pain at [facility] joint: Very pleasant lady who has had some hand arthritis off and on for years.  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  Now she is having some deep aching in the basal joint on the left hand.  [place] inj L  Past Medical History, Surgical History, Social History, Family History, Problem List, Medications, and Allergies have been reviewed and updated if relevant.  Patient Active Problem List   Diagnosis Date Noted  . Vaginal spotting 06/18/2017  . Arthritis of carpometacarpal [facility]) joint of left thumb 06/18/2017  . Post-nasal drip 08/24/2016  . Chronic pain of left knee 04/19/2016  . Hypertension 04/19/2016  . Obesity BMI > 30 04/21/2015  . History of laparoscopic adjustable gastric banding, 04/24/2011. 05/10/2011  . Fatty liver 12/01/2010  . Hyperlipidemia 08/06/2008    Past Medical History:  Diagnosis Date  . Arthritis    osteoarthritis  . Hyperlipidemia   . Menopause   . Morbid obesity ([place])   . PONV (postoperative nausea and vomiting)   . RUQ pain 2012   ruq pain with fatty lover of u/s, no gallstones and normal HIDA scan  . Seasonal allergies   . Sinus problem     Past Surgical History:  Procedure Laterality Date  . JOINT REPLACEMENT  2010, 2011   right on 12/2008, left on 07/2009  . LAPAROSCOPIC GASTRIC BANDING  04/24/2011   Procedure: LAPAROSCOPIC GASTRIC BANDING;  Surgeon: Shann Medal, MD;  Location:  [facility];  Service: General;  Laterality: N/A;  with Mesh under port  . TOTAL HIP ARTHROPLASTY  2010, 2012   Bilateral  . TUBAL LIGATION      Social History   Socioeconomic History  . Marital status: Divorced    Spouse name: Not on file  . Number of children: 1  . Years of education: Not on file  . Highest education level: Not on file  Occupational History  . Occupation: Retired from Consulting civil engineer: RETIRED  . Occupation: rental properties    Comment: part time  Social Needs  . Financial resource strain: Not on file  . Food insecurity:    Worry: Not on file    Inability: Not on file  . Transportation needs:    Medical: Not on file    Non-medical: Not on file  Tobacco Use  . Smoking status: Never Smoker  . Smokeless tobacco: Never Used  Substance and Sexual Activity  . Alcohol use: Yes    Alcohol/week: 1.0 standard drinks    Types: 1 Glasses of wine per week    Comment: 1-2 wine on weekends  . Drug use: No  . Sexual activity: Not on file  Lifestyle  . Physical activity:    Days per week: Not on file    Minutes per session: Not on file  . Stress: Not on file  Relationships  . Social connections:  Talks on phone: Not on file    Gets together: Not on file    Attends religious service: Not on file    Active member of club or organization: Not on file    Attends meetings of clubs or organizations: Not on file    Relationship status: Not on file  . Intimate partner violence:    Fear of current or ex partner: Not on file    Emotionally abused: Not on file    Physically abused: Not on file    Forced sexual activity: Not on file  Other Topics Concern  . Not on file  Social History Narrative   Regular exercise: yes, but more limited due to heel pain   Diet:ruits and veggies    Family History  Problem Relation Age of Onset  . Lymphoma Father   . Lung cancer Father   . Cancer Father 60       lymphoma, lung  . Osteoarthritis Father   . Hyperlipidemia Mother     . Osteoporosis Mother   . Cancer Mother   . Other Mother        spinal stenosis  . Diabetes Brother   . Other Brother        morbid obesity, lung issues  . Osteoarthritis Brother   . Sleep apnea Brother   . Osteoarthritis Sister   . Hyperlipidemia Sister   . Hypertension Sister   . Other Sister        morbid obesity  . Colon cancer Maternal Grandmother   . Cancer Maternal Grandmother        colon  . Coronary artery disease Paternal Grandmother     No Known Allergies  Medication list reviewed and updated in full in [place].  GEN: No fevers, chills. Nontoxic. Primarily MSK c/o today. MSK: Detailed in the HPI GI: tolerating PO intake without difficulty Neuro: No numbness, parasthesias, or tingling associated. Otherwise the pertinent positives of the ROS are noted above.   Objective:   BP 110/72   Pulse 71   Temp 98.3 F (36.8 C) (Oral)   Ht 5' 3.75" (1.619 m)   Wt 207 lb 12 oz (94.2 kg)   BMI 35.94 kg/m    GEN: WDWN, NAD, Non-toxic, Alert & Oriented x 3 HEENT: Atraumatic, Normocephalic.  Ears and Nose: No external deformity. EXTR: No clubbing/cyanosis/edema NEURO: Normal gait.  PSYCH: Normally interactive. Conversant. Not depressed or anxious appearing.  Calm demeanor.   l hand Ecchymosis or edema: neg ROM wrist/hand/digits: modest restriction only Carpals, MCP's, digits: CMC joint B TTP Distal Ulna and Radius: NT Ecchymosis or edema: neg No instability Cysts/nodules: neg Digit triggering: early heberdens nodes Finkelstein's test: neg Snuffbox tenderness: neg Scaphoid tubercle: NT Resisted supination: NT Full composite fist, no malrotation Grip, all digits: 5/5 str DIPJT: NT PIP JT: NT MCP JT: NT No tenosynovitis Axial load test: neg Phalen's: neg Tinel's: neg Atrophy: neg  Hand sensation: intact   Radiology: No results found.  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 We will do a diagnostic and therapeutic injection to see if we can get her some relief.   CMC joint injection, L Verbal consent was obtained. Risks (including rare infection, pain), benefits, and alternatives were discussed. Prepped with Chloraprep and Ethyl Chloride used for anesthesia. Under sterile conditions, patient injected into [facility]st [facility] joint  at joint line in apex of snuffbox inserting perpendicularly with traction placed on thumb. Aspiration yields no blood. Decreased pain post injection. No complications. Needle size: 25 gauge Injection: 1/2 cc of Lidocaine 1% and Depo-Medrol 20 mg   Follow-up: prn only  Meds ordered this encounter  Medications  . methylPREDNISolone acetate (DEPO-MEDROL) injection 20 mg   Signed,  Yuli Lanigan T. Foxx Klarich, MD   Allergies as of 01/06/2018   No Known Allergies     Medication List        Accurate as of 01/06/18 11:59 PM. Always use your most recent med list.          acetaminophen 500 MG tablet Commonly known as:  TYLENOL Take 500 mg by mouth every 6 (six) hours as needed.   BIOTIN PO Take 1,000 mcg by mouth daily.   CALCIUM 500 PO Take 3 tablets by mouth daily.   cetirizine 10 MG tablet Commonly known as:  ZYRTEC Take 10 mg by mouth daily as needed for allergies. Reported on 06/17/2015   cholecalciferol 1000 units tablet Commonly known as:  VITAMIN D Take 2,000 Units by mouth daily.   hydrochlorothiazide 25 MG tablet Commonly known as:  HYDRODIURIL Take 1 tablet (25 mg total) by mouth daily.   Melatonin 3 MG Tabs Take 2 tablets by mouth at bedtime as needed. Reported on 06/17/2015   meloxicam 15 MG tablet Commonly known as:  MOBIC Take 1 tablet (15 mg total) by mouth daily.   multivitamin tablet Take 1 tablet by mouth daily.   predniSONE 10 MG (21) Tbpk tablet Commonly known as:  STERAPRED UNI-PAK 21 TAB Take 6 tablets on  day one and then one less tablet each day until complete. (6,5,4,3,2,1)   triamcinolone cream 0.1 % Commonly known as:  KENALOG Apply 1 application topically 2 (two) times daily. No to exceed 2 weeks.

## 2018-02-13 ENCOUNTER — Telehealth: Payer: BC Managed Care – PPO | Admitting: Family

## 2018-02-13 DIAGNOSIS — N390 Urinary tract infection, site not specified: Secondary | ICD-10-CM

## 2018-02-13 MED ORDER — CEPHALEXIN 500 MG PO CAPS: 500.0000 mg | ORAL_CAPSULE | Freq: Two times a day (BID) | ORAL | 0 refills | Status: DC

## 2018-02-13 NOTE — Progress Notes (Signed)

## 2018-04-25 IMAGING — DX DG KNEE COMPLETE 4+V*L*
4 series · 4 of 4 positions shown · non-contrast
Comparison: None in PACs

CLINICAL DATA: Chronic left knee pain along the medial joint line.
No known trauma.

EXAM:
LEFT KNEE - COMPLETE 4+ VIEW

[knee ap]
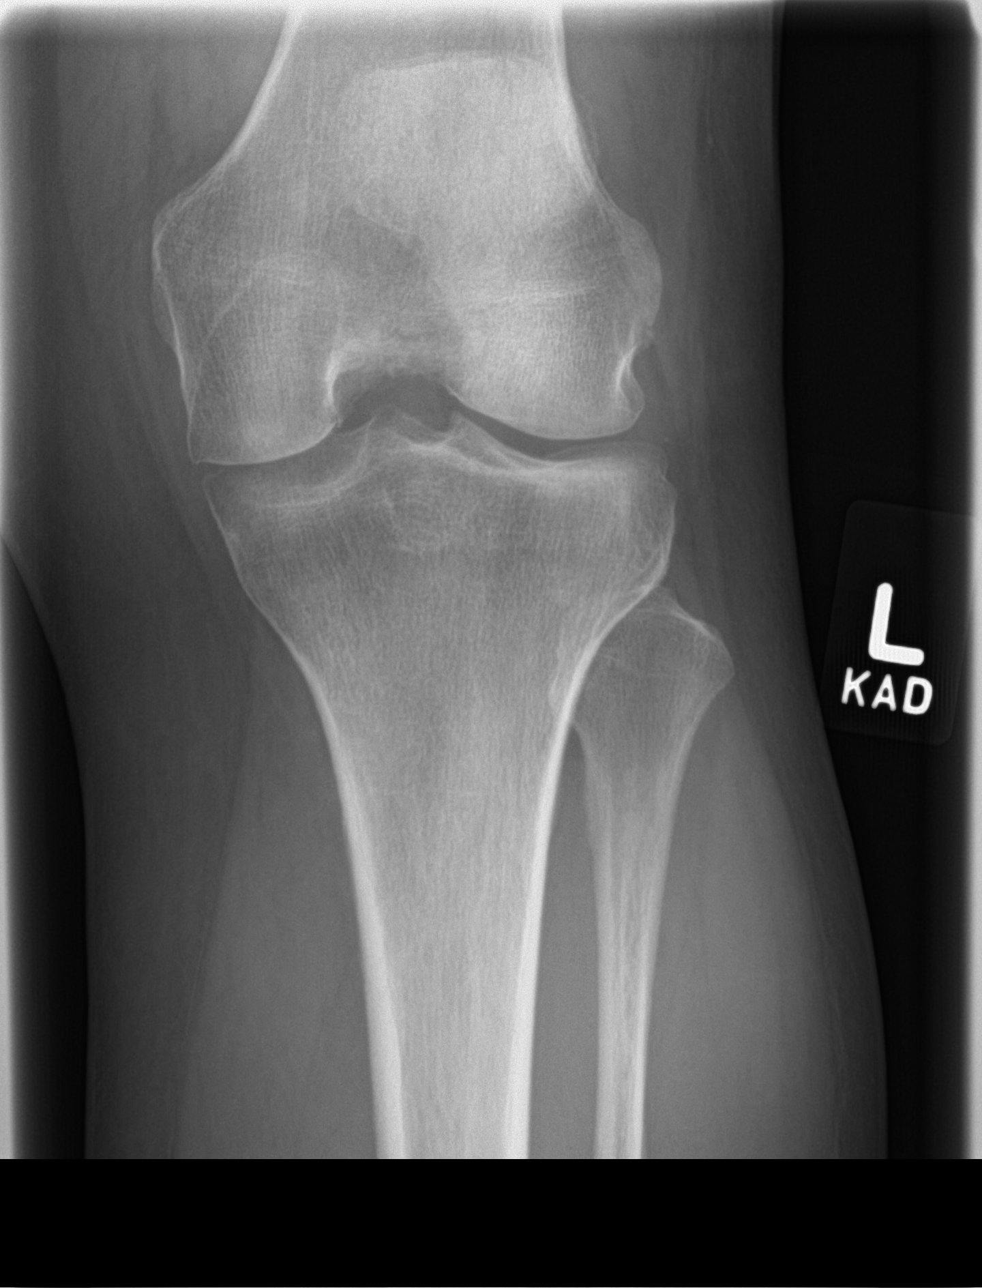

[knee tunnel]
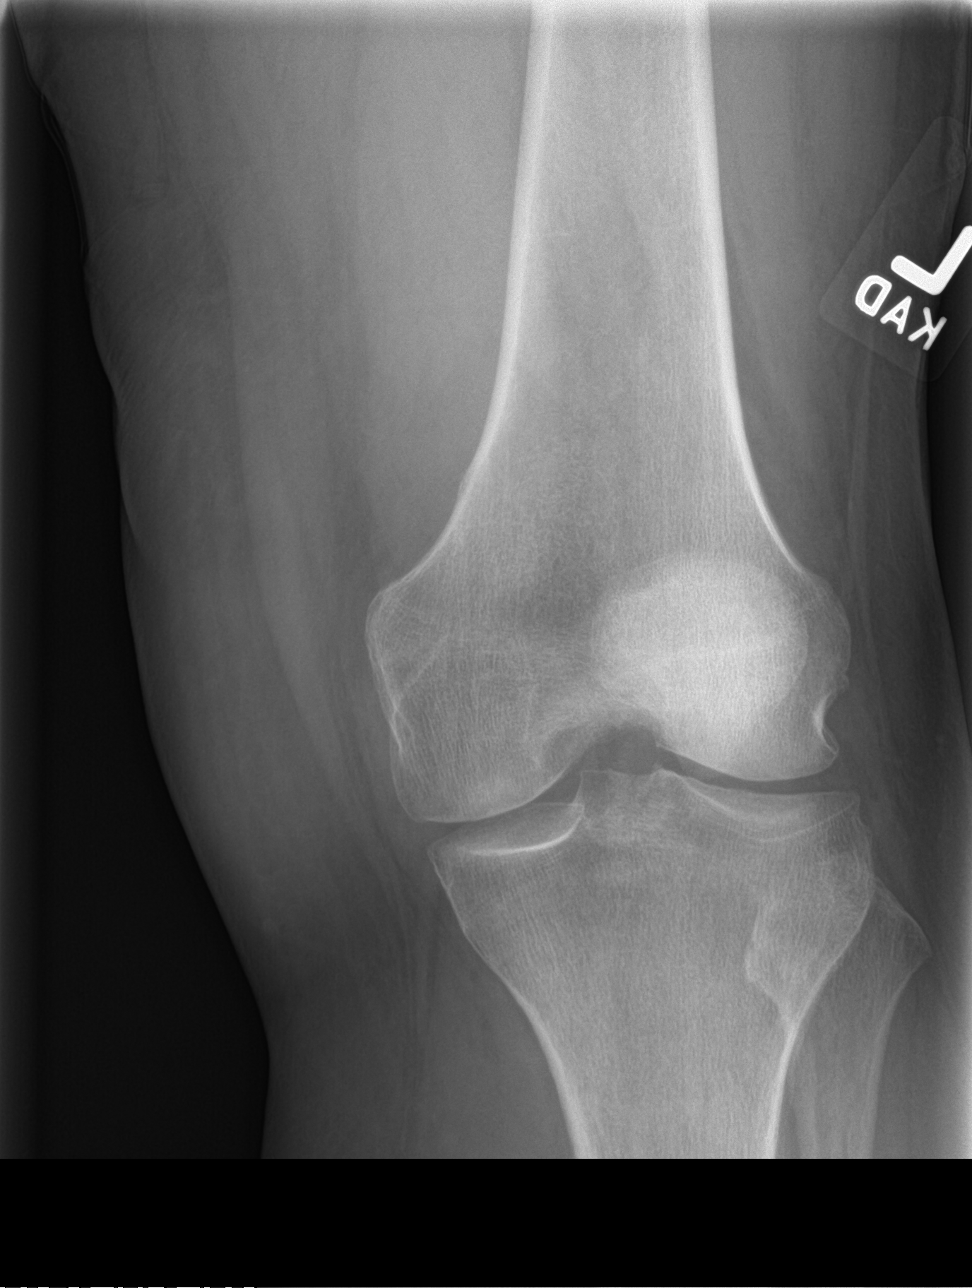

[knee lat]
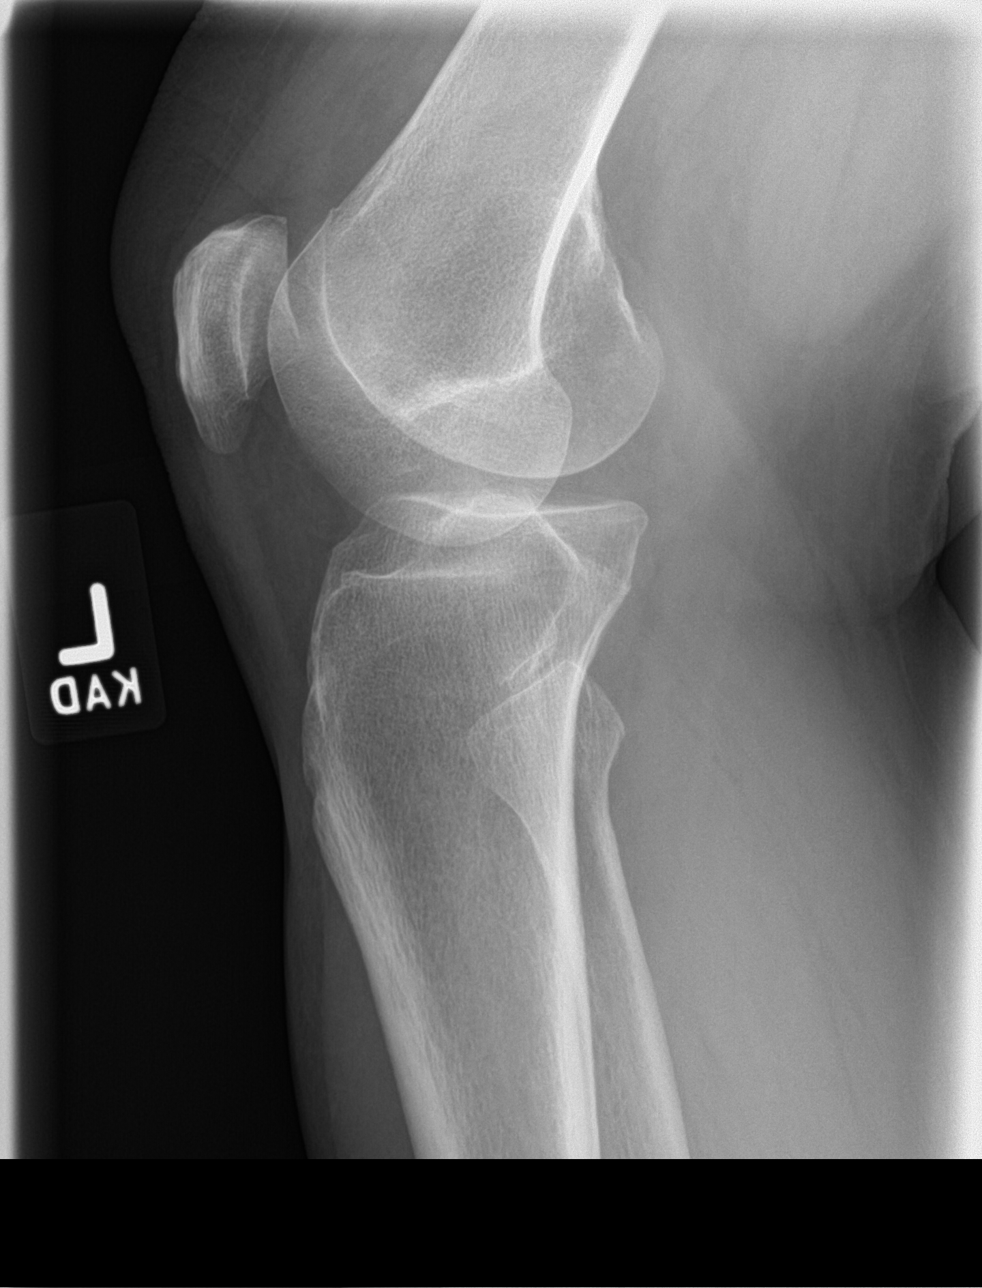

[patella skyline]
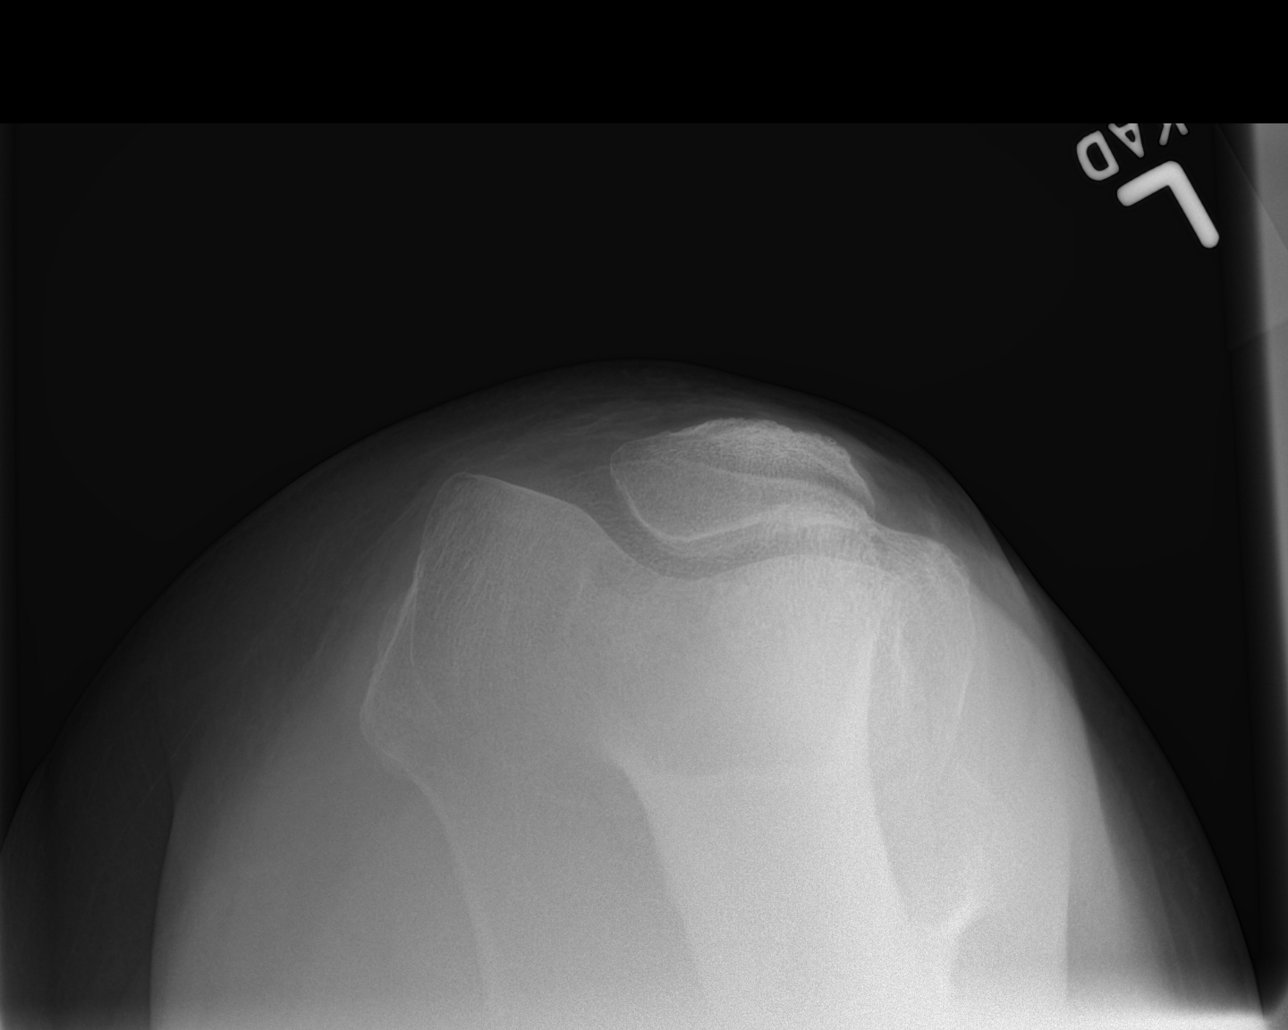

[4 of 4 positions shown; findings below may reference images not displayed]

FINDINGS: The bones are subjectively adequately mineralized. The joint spaces
are preserved. There is mild beaking of the tibial spines. There is
no joint effusion or chondrocalcinosis. There is no acute or healing
fracture.
IMPRESSION: Minimal degenerative change centered on the tibial spines. No
significant joint space loss. If further imaging is felt indicated
clinically, MRI of the knee would be useful.

## 2018-08-15 ENCOUNTER — Other Ambulatory Visit: Payer: Self-pay

## 2018-08-15 ENCOUNTER — Ambulatory Visit (INDEPENDENT_AMBULATORY_CARE_PROVIDER_SITE_OTHER): Payer: BC Managed Care – PPO | Admitting: Family Medicine

## 2018-08-15 ENCOUNTER — Encounter: Payer: Self-pay | Admitting: Family Medicine

## 2018-08-15 DIAGNOSIS — K219 Gastro-esophageal reflux disease without esophagitis: Secondary | ICD-10-CM | POA: Diagnosis not present

## 2018-08-15 NOTE — Progress Notes (Signed)
Virtual Visit via Video Note  I connected with Elizabeth Lowery on 08/15/18 at  3:30 PM EDT by a video enabled telemedicine application and verified that I am speaking with the correct person using two identifiers.  The patient and I tried to connect via WebEx, she was unable to connect on her hand.  We completed the visit via telephone.   I discussed the limitations of evaluation and management by telemedicine and the availability of in person appointments. The patient expressed understanding and agreed to proceed.   History of Present Illness: Patient is a 63 yo female who requests virtual visit to discuss burning in her throat. Has had in the past, but recently has had over last several weeks. She notices burning in her throat with eating. Burning subsides shortly after eating. Only with food, not liquids. Salty and foods with tomato and spice seem to make it worse. No vomiting, nausea or abdominal pain. Food feels a little stuck. No sharp or stabbing pain. Had a viral URI last month, symptoms resolved. No blood or mucus in stools. No change in weight. No regular NSAIDs.    Observations/Objective: Patient answers questions appropriately.  She was able to converse in complete sentences without difficulty.  Assessment and Plan:  Gastroesophageal reflux disease, esophagitis presence not specified -I provided her with verbal and written information regarding diagnosis and treatment (written information was sent via my chart) -Discussed starting with over-the-counter Gaviscon with meals and if no improvement in couple of days try famotidine AC 10 mg twice daily for a week if no improvement notify the office or if worsening symptoms, difficulty swallowing, fever, abdominal pain or vomiting -Consider GI referral if no improvement with conservative measures   Follow Up Instructions:    I discussed the assessment and treatment plan with the patient. The patient was provided an opportunity to ask  questions and all were answered. The patient agreed with the plan and demonstrated an understanding of the instructions.   The patient was advised to call back or seek an in-person evaluation if the symptoms worsen or if the condition fails to improve as anticipated.  I provided 17 minutes of non-face-to-face time during this encounter.   Elby Beck, FNP

## 2018-10-16 ENCOUNTER — Telehealth: Payer: BC Managed Care – PPO | Admitting: Physician Assistant

## 2018-10-16 DIAGNOSIS — L255 Unspecified contact dermatitis due to plants, except food: Secondary | ICD-10-CM

## 2018-10-16 MED ORDER — TRIAMCINOLONE ACETONIDE 0.1 % EX CREA: 1.0000 "application " | TOPICAL_CREAM | Freq: Two times a day (BID) | CUTANEOUS | 0 refills | Status: DC

## 2018-10-16 NOTE — Progress Notes (Signed)
I have spent 5 minutes in review of e-visit questionnaire, review and updating patient chart, medical decision making and response to patient.   Bianey Tesoro Cody Jiyaan Steinhauser, PA-C    

## 2018-10-16 NOTE — Progress Notes (Signed)
We are sorry that you are not feeing well.  Here is how we plan to help!  Based on what you have shared with me it looks like you have had an allergic reaction to the oily resin from a group of plants.  This resin is very sticky, so it easily attaches to your skin, clothing, tools equipment, and pet's fur.    This blistering rash is often called poison ivy rash although it can come from contact with the leaves, stems and roots of poison ivy, poison oak and poison sumac.  The oily resin contains urushiol (u-ROO-she-ol) that produces a skin rash on exposed skin.  The severity of the rash depends on the amount of urushiol that gets on your skin.  A section of skin with more urushiol on it may develop a rash sooner.  The rash usually develops 12-48 hours after exposure and can last two to three weeks.  Your skin must come in direct contact with the plant's oil to be affected.  Blister fluid doesn't spread the rash.  However, if you come into contact with a piece of clothing or pet fur that has urushiol on it, the rash may spread out.  You can also transfer the oil to other parts of your body with your fingers.  Often the rash looks like a straight line because of the way the plant brushes against your skin.    I have developed the following plan to treat your condition Most poison ivy treatments are usually limited to self-care methods. Small areas of the rash will typically go away on its own in two to three weeks.  Since your rash is limited, I am recommending that you follow these recommendations:  Make sure that the clothes you were wearing and any towels or sheets that may have come in contact with the oil (urushiol) are washed in detergent and hot water.  Apply Benadryl or Caladryl lotion to the rash.  You may apply these as often as needed to control the itching.  Cool baths also often help with itching.  I am prescribing you a prescription steroid cream to apply twice daily.  Soak in a cool-water  bath containing an oatmeal-based bath product (Aveeno).  Place Cool, wet compresses on the affected area for 15 to 30 minutes several times a day.  Avoid a hot shower or bath as this may increase your itching.       What can you do to prevent this rash?  Avoid the plants.  Learn how to identify poison ivy, poison oak and poison sumac in all seasons.  When hiking or engaging in other activities that might expose you to these plants, try to stay on cleared pathways.  If camping, make sure you pitch your tent in an area free of these plants.  Keep pets from running through wooded areas so that urushiol doesn't accidentally stick to their fur, which you may touch.  Remove or kill the plants.  In your yard, you can get rid of poison ivy by applying an herbicide or pulling it out of the ground, including the roots, while wearing heavy gloves.  Afterward remove the gloves and thoroughly wash them and your hands.  Don't burn poison ivy or related plants because the urushiol can be carried by smoke.  Wear protective clothing.  If needed, protect your skin by wearing socks, boots, pants, long sleeves and vinyl gloves.  Wash your skin right away.  Washing off the oil with soap and  water within 30 minutes of exposure may reduce your chances of getting a poison ivy rash.  Even washing after an hour or so can help reduce the severity of the rash.  If you walk through some poison ivy and then later touch your shoes, you may get some urushiol on your hands, which may then transfer to your face or body by touching or rubbing.  If the contaminated object isn't cleaned, the urushiol on it can still cause a skin reaction years later.    Be careful not to reuse towels after you have washed your skin.  Also carefully wash clothing in detergent and hot water to remove all traces of the oil.  Handle contaminated clothing carefully so you don't transfer the urushiol to yourself, furniture, rugs or appliances.  Remember  that pets can carry the oil on their fur and paws.  If you think your pet may be contaminated with urushiol, put on some long rubber gloves and give your pet a bath.  Finally, be careful not to burn these plants as the smoke can contain traces of the oil.  Inhaling the smoke may result in difficulty breathing. If that occurred you should see a physician as soon as possible.  See your doctor right away if:   The reaction is severe or widespread  You inhaled the smoke from burning poison ivy and are having difficulty breathing  Your skin continues to swell  The rash affects your eyes, mouth or genitals  Blisters are oozing pus  You develop a fever greater than 100 F (37.8 C)  The rash doesn't get better within a few weeks.  If you scratch the poison ivy rash, bacteria under your fingernails may cause the skin to become infected.  See your doctor if pus starts oozing from the blisters.  Treatment generally includes antibiotics.  Poison ivy treatments are usually limited to self-care methods.  And the rash typically goes away on its own in two to three weeks.     If the rash is widespread or results in a large number of blisters, your doctor may prescribe an oral corticosteroid, such as prednisone.  If a bacterial infection has developed at the rash site, your doctor may give you a prescription for an oral antibiotic.  MAKE SURE YOU   Understand these instructions.  Will watch your condition.  Will get help right away if you are not doing well or get worse.  Thank you for choosing an e-visit. Your e-visit answers were reviewed by a board certified advanced clinical practitioner to complete your personal care plan. Depending upon the condition, your plan could have included both over the counter or prescription medications.  Please review your pharmacy choice. If there is a problem you may use MyChart messaging to have the prescription routed to another pharmacy.   Your safety is  important to [place]. If you have drug allergies check your prescription carefully.  You can use MyChart to ask questions about today's visit, request a non-urgent call back, or ask for a work or school excuse for 24 hours related to this e-Visit. If it has been greater than 24 hours you will need to follow up with your provider, or enter a new e-Visit to address those concerns.   You will get an email in the next two days asking about your experience. I hope that your e-visit has been valuable and will speed your recovery Thank you for choosing an e-visit.

## 2018-10-21 ENCOUNTER — Encounter: Payer: BC Managed Care – PPO | Admitting: Family Medicine

## 2019-02-04 ENCOUNTER — Telehealth: Payer: Self-pay

## 2019-02-04 NOTE — Telephone Encounter (Signed)
Left detailed VM w COVID screen and back door lab info   

## 2019-02-05 ENCOUNTER — Encounter: Payer: BC Managed Care – PPO | Admitting: Family Medicine

## 2019-02-05 ENCOUNTER — Telehealth: Payer: Self-pay | Admitting: Family Medicine

## 2019-02-05 DIAGNOSIS — E782 Mixed hyperlipidemia: Secondary | ICD-10-CM

## 2019-02-05 NOTE — Telephone Encounter (Signed)
-----   Message from Cloyd Stagers, RT sent at 01/27/2019  2:20 PM EDT ----- Regarding: Lab Orders for Friday 9.18.2020 Please place lab orders for Friday 9.18.2020, mychart visit for physical on Friday 9.25.2020 Thank you, Dyke Maes RT(R)

## 2019-02-06 ENCOUNTER — Other Ambulatory Visit: Payer: Self-pay

## 2019-02-06 ENCOUNTER — Other Ambulatory Visit (INDEPENDENT_AMBULATORY_CARE_PROVIDER_SITE_OTHER): Payer: Medicare Other

## 2019-02-06 DIAGNOSIS — E782 Mixed hyperlipidemia: Secondary | ICD-10-CM

## 2019-02-06 LAB — LIPID PANEL
Cholesterol: 217 mg/dL — ABNORMAL HIGH (ref 0–200)
HDL: 50.9 mg/dL (ref >39.00)
LDL Cholesterol: 150 mg/dL — ABNORMAL HIGH (ref 0–99)
NonHDL: 166.01
Total CHOL/HDL Ratio: 4
Triglycerides: 81 mg/dL (ref 0.0–149.0)
VLDL: 16.2 mg/dL (ref 0.0–40.0)

## 2019-02-06 LAB — COMPREHENSIVE METABOLIC PANEL
ALT: 17 U/L (ref 0–35)
AST: 18 U/L (ref 0–37)
Albumin: 3.9 g/dL (ref 3.5–5.2)
Alkaline Phosphatase: 50 U/L (ref 39–117)
BUN: 18 mg/dL (ref 6–23)
CO2: 28 mEq/L (ref 19–32)
Calcium: 9.4 mg/dL (ref 8.4–10.5)
Chloride: 107 mEq/L (ref 96–112)
Creatinine, Ser: 0.86 mg/dL (ref 0.40–1.20)
GFR: 66.19 mL/min (ref >60.00)
Glucose, Bld: 87 mg/dL (ref 70–99)
Potassium: 4.1 mEq/L (ref 3.5–5.1)
Sodium: 142 mEq/L (ref 135–145)
Total Bilirubin: 0.5 mg/dL (ref 0.2–1.2)
Total Protein: 6.4 g/dL (ref 6.0–8.3)

## 2019-02-06 NOTE — Progress Notes (Signed)
No critical labs need to be addressed urgently. We will discuss labs in detail at upcoming office visit.   

## 2019-02-13 ENCOUNTER — Other Ambulatory Visit: Payer: Self-pay

## 2019-02-13 ENCOUNTER — Ambulatory Visit (INDEPENDENT_AMBULATORY_CARE_PROVIDER_SITE_OTHER): Payer: Medicare Other | Admitting: Family Medicine

## 2019-02-13 ENCOUNTER — Encounter: Payer: Self-pay | Admitting: Family Medicine

## 2019-02-13 VITALS — BP 130/82 | HR 98 | Temp 98.8°F | Ht 63.5 in | Wt 200.5 lb

## 2019-02-13 DIAGNOSIS — Z23 Encounter for immunization: Secondary | ICD-10-CM

## 2019-02-13 DIAGNOSIS — E782 Mixed hyperlipidemia: Secondary | ICD-10-CM

## 2019-02-13 DIAGNOSIS — I10 Essential (primary) hypertension: Secondary | ICD-10-CM

## 2019-02-13 DIAGNOSIS — Z0000 Encounter for general adult medical examination without abnormal findings: Secondary | ICD-10-CM

## 2019-02-13 NOTE — Assessment & Plan Note (Signed)
Encouraged exercise, weight loss, healthy eating habits. ? ?

## 2019-02-13 NOTE — Progress Notes (Signed)
Chief Complaint  Patient presents with  . Welcome to Medicare    History of Present Illness: HPI  The patient presents for WELCOME to [place], complete physical and review of chronic health problems. He/She also has the following acute concerns today:none   I have personally reviewed the Medicare Annual Wellness questionnaire and have noted 1. The patient's medical and social history 2. Their use of alcohol, tobacco or illicit drugs 3. Their current medications and supplements 4. The patient's functional ability including ADL's, fall risks, home safety risks and hearing or visual             impairment. 5. Diet and physical activities 6. Evidence for depression or mood disorders 7.         Updated provider list Cognitive evaluation was performed and recorded on pt medicare questionnaire form. The patients weight, height, BMI and visual acuity have been recorded in the chart  I have made referrals, counseling and provided education to the patient based review of the above and I have provided the pt with a written personalized care plan for preventive services. ,  Documentation of this information was scanned into the electronic record under the media tab.   Advance directives and end of life planning reviewed in detail with patient and documented in EMR. Patient given handout on advance care directives if needed. HCPOA and living will updated if needed.  Hearing Screening   Method: Audiometry   125Hz  250Hz  500Hz  1000Hz  2000Hz  3000Hz  4000Hz  6000Hz  8000Hz   Right ear:   20 20 20  20     Left ear:   20 20 20  20       Visual Acuity Screening   Right eye Left eye Both eyes  Without correction: 20/20 20/15 20/15   With correction:       Fall Risk  02/13/2019  Falls in the past year? 0     Office Visit from 02/13/2019 in [place] at [facility] Total Score  0     Elevated Cholesterol:  Poor control Lab Results  Component Value Date   CHOL 217 (H)  02/06/2019   HDL 50.90 02/06/2019   LDLCALC 150 (H) 02/06/2019   LDLDIRECT 131.4 11/26/2011   TRIG 81.0 02/06/2019   CHOLHDL 4 02/06/2019  Using medications without problems: Muscle aches:  Diet compliance: she has lost 7 lbs.. she is eating more plant based., less dairy and less sugar. Exercise: 2 days on treadmill, weight training, 3 days 3 miles walking. Other complaints:  Hx of lap band surgery in 2012 Wt Readings from Last 3 Encounters:  02/13/19 200 lb 8 oz (90.9 kg)  01/06/18 207 lb 12 oz (94.2 kg)  06/18/17 198 lb (89.8 kg)  Body mass index is 34.96 kg/m.    Hypertension:    Good control on no med.  BP Readings from Last 3 Encounters:  02/13/19 130/82  01/06/18 110/72  06/18/17 118/80  Using medication without problems or lightheadedness: none  Chest pain with exertion:none Edema:none Short of breath:none Average home BPs: Other issues:     COVID 19 screen No recent travel or known exposure to [place] The patient denies respiratory symptoms of COVID 19 at this time.  The importance of social distancing was discussed today.   Review of Systems  Constitutional: Negative for chills and fever.  HENT: Negative for congestion and ear pain.   Eyes: Negative for pain and redness.  Respiratory: Negative for cough and shortness of breath.   Cardiovascular: Negative for chest  pain, palpitations and leg swelling.  Gastrointestinal: Negative for abdominal pain, blood in stool, constipation, diarrhea, nausea and vomiting.  Genitourinary: Negative for dysuria.  Musculoskeletal: Negative for falls and myalgias.  Skin: Negative for rash.  Neurological: Negative for dizziness.  Psychiatric/Behavioral: Negative for depression. The patient is not nervous/anxious.       Past Medical History:  Diagnosis Date  . Arthritis    osteoarthritis  . Hyperlipidemia   . Menopause   . Morbid obesity ([place])   . PONV (postoperative nausea and vomiting)   . RUQ pain 2012   ruq pain  with fatty lover of u/s, no gallstones and normal HIDA scan  . Seasonal allergies   . Sinus problem     reports that she has never smoked. She has never used smokeless tobacco. She reports current alcohol use of about 1.0 standard drinks of alcohol per week. She reports that she does not use drugs.   Current Outpatient Medications:  .  acetaminophen (TYLENOL) 500 MG tablet, Take 500 mg by mouth every 6 (six) hours as needed., Disp: , Rfl:  .  BIOTIN PO, Take 1,000 mcg by mouth daily., Disp: , Rfl:  .  Calcium Carbonate (CALCIUM 500 PO), Take 3 tablets by mouth daily., Disp: , Rfl:  .  cholecalciferol (VITAMIN D) 1000 UNITS tablet, Take 2,000 Units by mouth daily. , Disp: , Rfl:  .  levocetirizine (XYZAL) 5 MG tablet, Take 5 mg by mouth every evening., Disp: , Rfl:  .  Melatonin 3 MG TABS, Take 2 tablets by mouth at bedtime as needed. Reported on 06/17/2015, Disp: , Rfl:  .  Multiple Vitamin (MULTIVITAMIN) tablet, Take 1 tablet by mouth daily. , Disp: , Rfl:  .  Omega-3 Fatty Acids (FISH OIL) 1000 MG CAPS, Take 1 capsule by mouth daily., Disp: , Rfl:    Observations/Objective: Blood pressure 130/82, pulse 98, temperature 98.8 F (37.1 C), temperature source Temporal, height 5' 3.5" (1.613 m), weight 200 lb 8 oz (90.9 kg), SpO2 97 %.  Physical Exam Constitutional:      General: She is not in acute distress.    Appearance: Normal appearance. She is well-developed. She is not ill-appearing or toxic-appearing.  HENT:     Head: Normocephalic.     Right Ear: Hearing, tympanic membrane, ear canal and external ear normal.     Left Ear: Hearing, tympanic membrane, ear canal and external ear normal.     Nose: Nose normal.  Eyes:     General: Lids are normal. Lids are everted, no foreign bodies appreciated.     Conjunctiva/sclera: Conjunctivae normal.     Pupils: Pupils are equal, round, and reactive to light.  Neck:     Musculoskeletal: Normal range of motion and neck supple.     Thyroid: No  thyroid mass or thyromegaly.     Vascular: No carotid bruit.     Trachea: Trachea normal.  Cardiovascular:     Rate and Rhythm: Normal rate and regular rhythm.     Heart sounds: Normal heart sounds, S1 normal and S2 normal. No murmur. No gallop.   Pulmonary:     Effort: Pulmonary effort is normal. No respiratory distress.     Breath sounds: Normal breath sounds. No wheezing, rhonchi or rales.  Abdominal:     General: Bowel sounds are normal. There is no distension or abdominal bruit.     Palpations: Abdomen is soft. There is no fluid wave or mass.     Tenderness: There is  no abdominal tenderness. There is no guarding or rebound.     Hernia: No hernia is present.  Lymphadenopathy:     Cervical: No cervical adenopathy.  Skin:    General: Skin is warm and dry.     Findings: No rash.  Neurological:     Mental Status: She is alert.     Cranial Nerves: No cranial nerve deficit.     Sensory: No sensory deficit.  Psychiatric:        Mood and Affect: Mood is not anxious or depressed.        Speech: Speech normal.        Behavior: Behavior normal. Behavior is cooperative.        Judgment: Judgment normal.     EKG: Normal sinus rhythm. Normal axis, normal R wave progression, No acute ST elevation or depression.  Assessment and Plan   The patient's preventative maintenance and recommended screening tests for an annual wellness exam were reviewed in full today. Brought up to date unless services declined.  Counselled on the importance of diet, exercise, and its role in overall health and mortality. The patient's FH and SH was reviewed, including their home life, tobacco status, and drug and alcohol status.   PAP, every5years, Last done 2016, DVE done today 2019. Mammo 06/2017, plan repeat Colon,Gessner, nml repeat 2027  Vaccines: UptoDate.. Given flu  And PNA DEXA: 06/2017 nml repeat in 5 years Hep C:neg HIV: refused.   Eliezer Lofts, MD

## 2019-02-13 NOTE — Patient Instructions (Addendum)
Work on healthy eating and consider recheck chol in 3 months.   Elizabeth Lowery , Thank you for taking time to come for your Medicare Wellness Visit. I appreciate your ongoing commitment to your health goals. Please review the following plan we discussed and let me know if I can assist you in the future.    This is a list of the screening recommended for you and due dates:  Health Maintenance  Topic Date Due  . HIV Screening  02/13/2020*  . Mammogram  06/29/2019  . Pneumonia vaccines (2 of 2 - PPSV23) 02/13/2020  . DEXA scan (bone density measurement)  06/28/2022  . Colon Cancer Screening  08/24/2025  . Tetanus Vaccine  06/19/2027  . Flu Shot  Completed  .  Hepatitis C: One time screening is recommended by Center for Disease Control  (CDC) for  adults born from [birth year] through 1967.   Completed  *Topic was postponed. The date shown is not the original due date.    Preventive Care 57 Years and Older, Female Preventive care refers to lifestyle choices and visits with your health care provider that can promote health and wellness. This includes:  A yearly physical exam. This is also called an annual well check.  Regular dental and eye exams.  Immunizations.  Screening for certain conditions.  Healthy lifestyle choices, such as diet and exercise. What can I expect for my preventive care visit? Physical exam Your health care provider will check:  Height and weight. These may be used to calculate body mass index (BMI), which is a measurement that tells if you are at a healthy weight.  Heart rate and blood pressure.  Your skin for abnormal spots. Counseling Your health care provider may ask you questions about:  Alcohol, tobacco, and drug use.  Emotional well-being.  Home and relationship well-being.  Sexual activity.  Eating habits.  History of falls.  Memory and ability to understand (cognition).  Work and work Statistician.  Pregnancy and menstrual history. What  immunizations do I need?  Influenza (flu) vaccine  This is recommended every year. Tetanus, diphtheria, and pertussis (Tdap) vaccine  You may need a Td booster every 10 years. Varicella (chickenpox) vaccine  You may need this vaccine if you have not already been vaccinated. Zoster (shingles) vaccine  You may need this after age 52. Pneumococcal conjugate (PCV13) vaccine  One dose is recommended after age 35. Pneumococcal polysaccharide (PPSV23) vaccine  One dose is recommended after age 23. Measles, mumps, and rubella (MMR) vaccine  You may need at least one dose of MMR if you were born in 1957 or later. You may also need a second dose. Meningococcal conjugate (MenACWY) vaccine  You may need this if you have certain conditions. Hepatitis A vaccine  You may need this if you have certain conditions or if you travel or work in places where you may be exposed to hepatitis A. Hepatitis B vaccine  You may need this if you have certain conditions or if you travel or work in places where you may be exposed to hepatitis B. Haemophilus influenzae type b (Hib) vaccine  You may need this if you have certain conditions. You may receive vaccines as individual doses or as more than one vaccine together in one shot (combination vaccines). Talk with your health care provider about the risks and benefits of combination vaccines. What tests do I need? Blood tests  Lipid and cholesterol levels. These may be checked every 5 years, or more frequently depending  on your overall health.  Hepatitis C test.  Hepatitis B test. Screening  Lung cancer screening. You may have this screening every year starting at age 26 if you have a 30-pack-year history of smoking and currently smoke or have quit within the past 15 years.  Colorectal cancer screening. All adults should have this screening starting at age 88 and continuing until age 75. Your health care provider may recommend screening at age 2 if  you are at increased risk. You will have tests every 1-10 years, depending on your results and the type of screening test.  Diabetes screening. This is done by checking your blood sugar (glucose) after you have not eaten for a while (fasting). You may have this done every 1-3 years.  Mammogram. This may be done every 1-2 years. Talk with your health care provider about how often you should have regular mammograms.  BRCA-related cancer screening. This may be done if you have a family history of breast, ovarian, tubal, or peritoneal cancers. Other tests  Sexually transmitted disease (STD) testing.  Bone density scan. This is done to screen for osteoporosis. You may have this done starting at age 2. Follow these instructions at home: Eating and drinking  Eat a diet that includes fresh fruits and vegetables, whole grains, lean protein, and low-fat dairy products. Limit your intake of foods with high amounts of sugar, saturated fats, and salt.  Take vitamin and mineral supplements as recommended by your health care provider.  Do not drink alcohol if your health care provider tells you not to drink.  If you drink alcohol: ? Limit how much you have to 0-1 drink a day. ? Be aware of how much alcohol is in your drink. In the [place] one drink equals one 12 oz bottle of beer (355 mL), one 5 oz glass of wine (148 mL), or one 1 oz glass of hard liquor (44 mL). Lifestyle  Take daily care of your teeth and gums.  Stay active. Exercise for at least 30 minutes on 5 or more days each week.  Do not use any products that contain nicotine or tobacco, such as cigarettes, e-cigarettes, and chewing tobacco. If you need help quitting, ask your health care provider.  If you are sexually active, practice safe sex. Use a condom or other form of protection in order to prevent STIs (sexually transmitted infections).  Talk with your health care provider about taking a low-dose aspirin or statin. What's  next?  Go to your health care provider once a year for a well check visit.  Ask your health care provider how often you should have your eyes and teeth checked.  Stay up to date on all vaccines. This information is not intended to replace advice given to you by your health care provider. Make sure you discuss any questions you have with your health care provider. Document Released: 06/03/2015 Document Revised: 05/01/2018 Document Reviewed: 05/01/2018 Elsevier Patient Education  2020 [employer].

## 2019-02-13 NOTE — Addendum Note (Signed)
Addended by: Carter Kitten on: 02/13/2019 04:02 PM   Modules accepted: Orders

## 2019-02-13 NOTE — Assessment & Plan Note (Signed)
Well controlled on no med. 

## 2019-04-26 ENCOUNTER — Telehealth: Payer: Medicare Other | Admitting: Family

## 2019-04-26 DIAGNOSIS — R399 Unspecified symptoms and signs involving the genitourinary system: Secondary | ICD-10-CM

## 2019-04-26 MED ORDER — CEPHALEXIN 500 MG PO CAPS: 500.0000 mg | ORAL_CAPSULE | Freq: Two times a day (BID) | ORAL | 0 refills | Status: DC

## 2019-04-26 NOTE — Progress Notes (Signed)

## 2019-07-04 ENCOUNTER — Ambulatory Visit: Payer: Medicare PPO | Attending: Internal Medicine

## 2019-07-04 DIAGNOSIS — Z23 Encounter for immunization: Secondary | ICD-10-CM

## 2019-07-04 NOTE — Progress Notes (Signed)
   Covid-19 Vaccination Clinic  Name:  Elizabeth Lowery    MRN: [id number] DOB: Jun 27, 1955  07/04/2019  [name] was observed post Covid-19 immunization for 15 minutes without incidence. She was provided with Vaccine Information Sheet and instruction to access the V-Safe system.   [name] was instructed to call 911 with any severe reactions post vaccine: Marland Kitchen Difficulty breathing  . Swelling of your face and throat  . A fast heartbeat  . A bad rash all over your body  . Dizziness and weakness    Immunizations Administered    Name Date Dose VIS Date Route   Pfizer COVID-19 Vaccine 07/04/2019  8:39 AM 0.3 mL 05/01/2019 Intramuscular   Manufacturer: [place]   Lot: [id number]   [place]: [id number]

## 2019-07-13 ENCOUNTER — Telehealth: Payer: Medicare PPO | Admitting: Physician Assistant

## 2019-07-13 DIAGNOSIS — N3000 Acute cystitis without hematuria: Secondary | ICD-10-CM | POA: Diagnosis not present

## 2019-07-13 MED ORDER — CEPHALEXIN 500 MG PO CAPS: 500.0000 mg | ORAL_CAPSULE | Freq: Two times a day (BID) | ORAL | 0 refills | Status: DC

## 2019-07-13 NOTE — Progress Notes (Signed)
We are sorry that you are not feeling well.  Here is how we plan to help!  Based on what you shared with me it looks like you most likely have a simple urinary tract infection.  A UTI (Urinary Tract Infection) is a bacterial infection of the bladder.  Most cases of urinary tract infections are simple to treat but a key part of your care is to encourage you to drink plenty of fluids and watch your symptoms carefully.  I have prescribed Keflex 500 mg twice a day for 7 days.  Your symptoms should gradually improve. Call us if the burning in your urine worsens, you develop worsening fever, back pain or pelvic pain or if your symptoms do not resolve after completing the antibiotic.  Urinary tract infections can be prevented by drinking plenty of water to keep your body hydrated.  Also be sure when you wipe, wipe from front to back and don't hold it in!  If possible, empty your bladder every 4 hours.  Your e-visit answers were reviewed by a board certified advanced clinical practitioner to complete your personal care plan.  Depending on the condition, your plan could have included both over the counter or prescription medications.  If there is a problem please reply  once you have received a response from your provider.  Your safety is important to [place].  If you have drug allergies check your prescription carefully.    You can use MyChart to ask questions about today's visit, request a non-urgent call back, or ask for a work or school excuse for 24 hours related to this e-Visit. If it has been greater than 24 hours you will need to follow up with your provider, or enter a new e-Visit to address those concerns.   You will get an e-mail in the next two days asking about your experience.  I hope that your e-visit has been valuable and will speed your recovery. Thank you for using e-visits.  Particia Nearing PA-C  Approximately 5 minutes was spent documenting and reviewing patient's chart.

## 2019-07-29 ENCOUNTER — Ambulatory Visit: Payer: Medicare PPO | Attending: Internal Medicine

## 2019-07-29 DIAGNOSIS — Z23 Encounter for immunization: Secondary | ICD-10-CM

## 2019-07-29 NOTE — Progress Notes (Signed)
   Covid-19 Vaccination Clinic  Name:  Elizabeth Lowery    MRN: [id number] DOB: 23-Apr-1956  07/29/2019  Elizabeth Lowery was observed post Covid-19 immunization for 15 minutes without incident. She was provided with Vaccine Information Sheet and instruction to access the V-Safe system.   Elizabeth Lowery was instructed to call 911 with any severe reactions post vaccine: Marland Kitchen Difficulty breathing  . Swelling of face and throat  . A fast heartbeat  . A bad rash all over body  . Dizziness and weakness   Immunizations Administered    Name Date Dose VIS Date Route   Pfizer COVID-19 Vaccine 07/29/2019  2:26 PM 0.3 mL 05/01/2019 Intramuscular   Manufacturer: [place]   Lot: [id number]   [place]: [id number]

## 2019-08-03 ENCOUNTER — Ambulatory Visit: Payer: Medicare PPO | Admitting: Family Medicine

## 2019-08-03 ENCOUNTER — Telehealth: Payer: Self-pay

## 2019-08-03 NOTE — Telephone Encounter (Signed)
Pt called and cancelled this appt

## 2019-08-03 NOTE — Telephone Encounter (Signed)
[place] Night - Client Nonclinical Telephone Record AccessNurse Client [place] Night - Client Client Site [place] Physician Owens Loffler - MD Contact Type Call Who Is Calling Patient / Member / Family / Caregiver Caller Name [place] Phone Number [phone] Patient Name Elizabeth Lowery Patient DOB Feb 17, 1956 Call Type Message Only Information Provided Reason for Call Request to [facility] Appointment Initial Comment Caller states they need to cancel an appointment. Additional Comment Caller states she needs to cancel her appointment with Dr. Lorelei Pont Disp. Time Disposition Final User 08/02/2019 9:43:46 AM General Information Provided Yes Gerhard Perches Call Closed By: Gerhard Perches Transaction Date/Time: 08/02/2019 9:41:07 AM (ET)

## 2020-04-30 ENCOUNTER — Telehealth: Payer: Medicare PPO | Admitting: Orthopedic Surgery

## 2020-04-30 DIAGNOSIS — R399 Unspecified symptoms and signs involving the genitourinary system: Secondary | ICD-10-CM | POA: Diagnosis not present

## 2020-04-30 MED ORDER — NITROFURANTOIN MONOHYD MACRO 100 MG PO CAPS: 100.0000 mg | ORAL_CAPSULE | Freq: Two times a day (BID) | ORAL | 0 refills | Status: DC

## 2020-04-30 NOTE — Progress Notes (Signed)

## 2020-07-22 LAB — HM MAMMOGRAPHY

## 2020-07-26 ENCOUNTER — Encounter: Payer: Self-pay | Admitting: Family Medicine

## 2020-08-14 ENCOUNTER — Telehealth: Payer: Medicare PPO | Admitting: Family

## 2020-08-14 DIAGNOSIS — R399 Unspecified symptoms and signs involving the genitourinary system: Secondary | ICD-10-CM | POA: Diagnosis not present

## 2020-08-14 MED ORDER — CEPHALEXIN 500 MG PO CAPS: 500.0000 mg | ORAL_CAPSULE | Freq: Two times a day (BID) | ORAL | 0 refills | Status: DC

## 2020-08-14 NOTE — Progress Notes (Signed)

## 2021-01-04 ENCOUNTER — Telehealth: Payer: Medicare PPO | Admitting: Physician Assistant

## 2021-01-04 DIAGNOSIS — J019 Acute sinusitis, unspecified: Secondary | ICD-10-CM | POA: Diagnosis not present

## 2021-01-04 DIAGNOSIS — B9789 Other viral agents as the cause of diseases classified elsewhere: Secondary | ICD-10-CM

## 2021-01-04 MED ORDER — IPRATROPIUM BROMIDE 0.03 % NA SOLN: 2.0000 | Freq: Two times a day (BID) | NASAL | 0 refills | Status: DC

## 2021-01-04 NOTE — Progress Notes (Signed)

## 2021-01-04 NOTE — Progress Notes (Signed)
I have spent 5 minutes in review of e-visit questionnaire, review and updating patient chart, medical decision making and response to patient.   Chanley Mcenery Cody Brenlynn Fake, PA-C    

## 2021-01-05 ENCOUNTER — Telehealth: Payer: Medicare PPO | Admitting: Physician Assistant

## 2021-01-05 DIAGNOSIS — H1013 Acute atopic conjunctivitis, bilateral: Secondary | ICD-10-CM

## 2021-01-06 MED ORDER — AZELASTINE HCL 0.05 % OP SOLN: 1.0000 [drp] | Freq: Two times a day (BID) | OPHTHALMIC | 12 refills | Status: DC

## 2021-01-06 NOTE — Progress Notes (Signed)
E-Visit for [employer]   We are sorry that you are not feeling well.  Here is how we plan to help!  Based on what you have shared with me it looks like you have conjunctivitis.  Conjunctivitis is a common inflammatory or infectious condition of the eye that is often referred to as "pink eye".  In most cases it is contagious (viral or bacterial). However, not all conjunctivitis requires antibiotics (ex. Allergic).  We have made appropriate suggestions for you based upon your presentation.  I have prescribed Azelastine 0.05% eye drops you apply 1 drop to each eye twice daily.   Pink eye can be highly contagious.  It is typically spread through direct contact with secretions, or contaminated objects or surfaces that one may have touched.  Strict handwashing is suggested with soap and water is urged.  If not available, use alcohol based had sanitizer.  Avoid unnecessary touching of the eye.  If you wear contact lenses, you will need to refrain from wearing them until you see no white discharge from the eye for at least 24 hours after being on medication.  You should see symptom improvement in 1-2 days after starting the medication regimen.  Call us if symptoms are not improved in 1-2 days.  Home Care: Wash your hands often! Do not wear your contacts until you complete your treatment plan. Avoid sharing towels, bed linen, personal items with a person who has pink eye. See attention for anyone in your home with similar symptoms.  Get Help Right Away If: Your symptoms do not improve. You develop blurred or loss of vision. Your symptoms worsen (increased discharge, pain or redness)   Thank you for choosing an e-visit.  Your e-visit answers were reviewed by a board certified advanced clinical practitioner to complete your personal care plan. Depending upon the condition, your plan could have included both over the counter or prescription medications.  Please review your pharmacy choice. Make sure the  pharmacy is open so you can pick up prescription now. If there is a problem, you may contact your provider through [employer] and have the prescription routed to another pharmacy.  Your safety is important to [place]. If you have drug allergies check your prescription carefully.   For the next 24 hours you can use MyChart to ask questions about today's visit, request a non-urgent call back, or ask for a work or school excuse. You will get an email in the next two days asking about your experience. I hope that your e-visit has been valuable and will speed your recovery.  I provided 5 minutes of non face-to-face time during this encounter for chart review and documentation.

## 2021-01-27 ENCOUNTER — Telehealth: Payer: Self-pay | Admitting: Family Medicine

## 2021-01-27 DIAGNOSIS — E782 Mixed hyperlipidemia: Secondary | ICD-10-CM

## 2021-01-27 NOTE — Telephone Encounter (Signed)
-----   Message from Ellamae Sia sent at 01/16/2021 10:48 AM EDT ----- Regarding: Lab orders for Friday, 9.16.22 Patient is scheduled for CPX labs, please order future labs, Thanks , Karna Christmas

## 2021-02-03 ENCOUNTER — Other Ambulatory Visit: Payer: Medicare PPO

## 2021-02-04 ENCOUNTER — Ambulatory Visit (INDEPENDENT_AMBULATORY_CARE_PROVIDER_SITE_OTHER): Payer: Medicare PPO | Admitting: Family Medicine

## 2021-02-04 ENCOUNTER — Other Ambulatory Visit: Payer: Self-pay

## 2021-02-04 VITALS — BP 131/70 | HR 76 | Ht 63.0 in | Wt 185.0 lb

## 2021-02-04 DIAGNOSIS — Z0000 Encounter for general adult medical examination without abnormal findings: Secondary | ICD-10-CM | POA: Diagnosis not present

## 2021-02-04 NOTE — Progress Notes (Signed)
Subjective:   KAILYNE WOOLFORK is a 65 y.o. female who presents for Medicare Annual (Subsequent) preventive examination.  Review of Systems     Ms. Lovena Le , Thank you for taking time to come for your Medicare Wellness Visit. I appreciate your ongoing commitment to your health goals. Please review the following plan we discussed and let me know if I can assist you in the future.   These are the goals we discussed:  Goals   None     This is a list of the screening recommended for you and due dates:  Health Maintenance  Topic Date Due   Zoster (Shingles) Vaccine (1 of 2) Never done   COVID-19 Vaccine (4 - Booster for Pfizer series) 07/12/2020   Flu Shot  12/19/2020   DEXA scan (bone density measurement)  06/28/2022   Mammogram  07/23/2022   Colon Cancer Screening  08/24/2025   Tetanus Vaccine  06/19/2027   Hepatitis C Screening: USPSTF Recommendation to screen - Ages 18-79 yo.  Completed   HPV Vaccine  Aged Out          Objective:    Today's Vitals   02/04/21 1005 02/04/21 1011  BP: 131/70   Pulse: 76   Weight: 185 lb (83.9 kg)   Height: '5\' 3"'$  (1.6 m)   PainSc: 0-No pain 0-No pain   Body mass index is 32.77 kg/m.  Advanced Directives 08/25/2015 04/18/2011  Does Patient Have a Medical Advance Directive? No Patient would not like information;Patient does not have advance directive  Would patient like information on creating a medical advance directive? No - patient declined information -    Current Medications (verified) Outpatient Encounter Medications as of 02/04/2021  Medication Sig   acetaminophen (TYLENOL) 500 MG tablet Take 500 mg by mouth every 6 (six) hours as needed.   BIOTIN PO Take 1,000 mcg by mouth daily.   Calcium Carbonate (CALCIUM 500 PO) Take 3 tablets by mouth daily.   cholecalciferol (VITAMIN D) 1000 UNITS tablet Take 2,000 Units by mouth daily.    ipratropium (ATROVENT) 0.03 % nasal spray Place 2 sprays into both nostrils every 12 (twelve) hours.    levocetirizine (XYZAL) 5 MG tablet Take 5 mg by mouth every evening.   Multiple Vitamin (MULTIVITAMIN) tablet Take 1 tablet by mouth daily.    Omega-3 Fatty Acids (FISH OIL) 1000 MG CAPS Take 1 capsule by mouth daily.   azelastine (OPTIVAR) 0.05 % ophthalmic solution Place 1 drop into both eyes 2 (two) times daily. (Patient not taking: Reported on 02/04/2021)   cephALEXin (KEFLEX) 500 MG capsule Take 1 capsule (500 mg total) by mouth 2 (two) times daily. (Patient not taking: Reported on 02/04/2021)   Melatonin 3 MG TABS Take 2 tablets by mouth at bedtime as needed. Reported on 06/17/2015 (Patient not taking: Reported on 02/04/2021)   nitrofurantoin, macrocrystal-monohydrate, (MACROBID) 100 MG capsule Take 1 capsule (100 mg total) by mouth 2 (two) times daily. (Patient not taking: Reported on 02/04/2021)   No facility-administered encounter medications on file as of 02/04/2021.    Allergies (verified) Patient has no known allergies.   History: Past Medical History:  Diagnosis Date   Arthritis    osteoarthritis   Hyperlipidemia    Menopause    Morbid obesity ([place])    PONV (postoperative nausea and vomiting)    RUQ pain 2012   ruq pain with fatty lover of u/s, no gallstones and normal HIDA scan   Seasonal allergies  Sinus problem    Past Surgical History:  Procedure Laterality Date   JOINT REPLACEMENT  2010, 2011   right on 12/2008, left on 07/2009   LAPAROSCOPIC GASTRIC BANDING  04/24/2011   Procedure: LAPAROSCOPIC GASTRIC BANDING;  Surgeon: Shann Medal, MD;  Location: [facility];  Service: General;  Laterality: N/A;  with Mesh under port   TOTAL HIP ARTHROPLASTY  2010, 2012   Bilateral   TUBAL LIGATION     Family History  Problem Relation Age of Onset   Lymphoma Father    Lung cancer Father    Cancer Father 27       lymphoma, lung   Osteoarthritis Father    Hyperlipidemia Mother    Osteoporosis Mother    Cancer Mother    Other Mother        spinal stenosis   Diabetes Brother     Other Brother        morbid obesity, lung issues   Osteoarthritis Brother    Sleep apnea Brother    Osteoarthritis Sister    Hyperlipidemia Sister    Hypertension Sister    Other Sister        morbid obesity   Colon cancer Maternal Grandmother    Cancer Maternal Grandmother        colon   Coronary artery disease Paternal Grandmother    Social History   Socioeconomic History   Marital status: Divorced    Spouse name: Not on file   Number of children: 1   Years of education: Not on file   Highest education level: Not on file  Occupational History   Occupation: Retired from Consulting civil engineer: RETIRED   Occupation: rental properties    Comment: part time  Tobacco Use   Smoking status: Never   Smokeless tobacco: Never  Substance and Sexual Activity   Alcohol use: Yes    Alcohol/week: 1.0 standard drink    Types: 1 Glasses of wine per week    Comment: 1-2 wine on weekends   Drug use: No   Sexual activity: Yes  Other Topics Concern   Not on file  Social History Narrative   Regular exercise: yes, but more limited due to heel painDiet:ruits and veggies.   Social Determinants of Health   Financial Resource Strain: Low Risk    Difficulty of Paying Living Expenses: Not hard at all  Food Insecurity: No Food Insecurity   Worried About Charity fundraiser in the Last Year: Never true   [place] in the Last Year: Never true  Transportation Needs: No Transportation Needs   Lack of Transportation (Medical): No   Lack of Transportation (Non-Medical): No  Physical Activity: Sufficiently Active   Days of Exercise per Week: 5 days   Minutes of Exercise per Session: 40 min  Stress: No Stress Concern Present   Feeling of Stress : Not at all  Social Connections: Not on file    Tobacco Counseling Counseling given: Not Answered   Clinical Intake:  Pre-visit preparation completed: Yes  Pain : No/denies pain Pain Score: 0-No pain     Nutritional Risks:  None Diabetes: No  How often do you need to have someone help you when you read instructions, pamphlets, or other written materials from your doctor or pharmacy?: 1 - Never What is the last grade level you completed in school?: Associate Degree  Diabetic?no   Interpreter Needed?: No  Information entered by :: [facility]   Activities  of Daily Living In your present state of health, do you have any difficulty performing the following activities: 02/04/2021  Hearing? N  Vision? N  Difficulty concentrating or making decisions? N  Walking or climbing stairs? N  Dressing or bathing? N  Doing errands, shopping? N  Some recent data might be hidden    Patient Care Team: Jinny Sanders, MD as PCP - General Paralee Cancel, MD as Consulting Physician (Orthopedic Surgery) Malena Edman, RD as Diabetes Educator Arbutus Leas, PhD as Consulting Physician (Psychology)  Indicate any recent Medical Services you may have received from other than [facility] providers in the past year (date may be approximate).     Assessment:   This is a routine wellness examination for Adaley.  Hearing/Vision screen No results found.  Dietary issues and exercise activities discussed: Type of exercise: Other - see comments, Time (Minutes): 40, Frequency (Times/Week): 5, Weekly Exercise (Minutes/Week): 200, Intensity: Mild, Exercise limited by: None identified   Goals Addressed   None    Depression Screen PHQ 2/9 Scores 02/04/2021 02/04/2021 02/13/2019 06/18/2017  PHQ - 2 Score 0 0 0 0    Fall Risk Fall Risk  02/04/2021 02/13/2019  Falls in the past year? 0 0  Number falls in past yr: 0 -  Injury with Fall? 0 -  Risk for fall due to : No Fall Risks -  Follow up Falls evaluation completed -    FALL RISK PREVENTION PERTAINING TO THE HOME:  Any stairs in or around the home? No  If so, are there any without handrails? No  Home free of loose throw rugs in walkways, pet beds, electrical cords, etc? No  Adequate  lighting in your home to reduce risk of falls? Yes   ASSISTIVE DEVICES UTILIZED TO PREVENT FALLS:  Life alert? No  Use of a cane, walker or w/c? No  Grab bars in the bathroom? Yes  Shower chair or bench in shower? No  Elevated toilet seat or a handicapped toilet? Yes   TIMED UP AND GO:  Was the test performed? No .  Length of time to ambulate 10 feet: n/a sec.   Gait steady and fast with assistive device  Cognitive Function:     6CIT Screen 02/04/2021  What Year? 0 points  What month? 0 points  What time? 0 points  Count back from 20 0 points  Months in reverse 0 points  Repeat phrase 2 points  Total Score 2    Immunizations Immunization History  Administered Date(s) Administered   Influenza Inj Mdck Quad Pf 03/06/2018   Influenza Split 03/07/2011, 03/04/2012   Influenza Whole 07/19/2009   Influenza,inj,Quad PF,6+ Mos 03/04/2013, 01/19/2014, 02/16/2015, 03/07/2016, 02/13/2019   Influenza-Unspecified 02/24/2017, 01/26/2020   PFIZER(Purple Top)SARS-COV-2 Vaccination 07/04/2019, 07/29/2019, 03/11/2020   PPD Test 02/16/2015   Pneumococcal Conjugate-13 02/13/2019   Td 05/21/2006   Tdap 06/18/2017   Zoster, Live 01/19/2014    TDAP status: Up to date  Flu Vaccine status: Due, Education has been provided regarding the importance of this vaccine. Advised may receive this vaccine at local pharmacy or Health Dept. Aware to provide a copy of the vaccination record if obtained from local pharmacy or Health Dept. Verbalized acceptance and understanding.  Pneumococcal vaccine status: Due, Education has been provided regarding the importance of this vaccine. Advised may receive this vaccine at local pharmacy or Health Dept. Aware to provide a copy of the vaccination record if obtained from local pharmacy or Health Dept. Verbalized acceptance and  understanding.  Covid-19 vaccine status: Completed vaccines  Qualifies for Shingles Vaccine? Yes   Zostavax completed Yes   Shingrix  Completed?: Yes  Screening Tests Health Maintenance  Topic Date Due   Zoster Vaccines- Shingrix (1 of 2) Never done   COVID-19 Vaccine (4 - Booster for Pfizer series) 07/12/2020   INFLUENZA VACCINE  12/19/2020   DEXA SCAN  06/28/2022   MAMMOGRAM  07/23/2022   COLONOSCOPY (Pts 45-68yr Insurance coverage will need to be confirmed)  08/24/2025   TETANUS/TDAP  06/19/2027   Hepatitis C Screening  Completed   HPV VACCINES  Aged Out    Health Maintenance  Health Maintenance Due  Topic Date Due   Zoster Vaccines- Shingrix (1 of 2) Never done   COVID-19 Vaccine (4 - Booster for Pfizer series) 07/12/2020   INFLUENZA VACCINE  12/19/2020    Colorectal cancer screening: Type of screening: Colonoscopy. Completed 2013. Repeat every 10 years  Mammogram status: Completed 08/2020. Repeat every year  Bone Density status: Completed due 2024. Results reflect: Bone density results: NORMAL. Repeat every 1 years.  Lung Cancer Screening: (Low Dose CT Chest recommended if Age 67-80years, 30 pack-year currently smoking OR have quit w/in 15years.) does not qualify.   Lung Cancer Screening Referral: n/a  Additional Screening:  Hepatitis C Screening: does not qualify; Completed n/a  Vision Screening: Recommended annual ophthalmology exams for early detection of glaucoma and other disorders of the eye. Is the patient up to date with their annual eye exam?  No  Who is the provider or what is the name of the office in which the patient attends annual eye exams? [facility] is not [facility] with a provider, would they like to be referred to a provider to establish care? No .   Dental Screening: Recommended annual dental exams for proper oral hygiene  Community Resource Referral / Chronic Care Management: CRR required this visit?  No   CCM required this visit?  No      Plan:     I have personally reviewed and noted the following in the patient's chart:   Medical and social  history Use of alcohol, tobacco or illicit drugs  Current medications and supplements including opioid prescriptions.  Functional ability and status Nutritional status Physical activity Advanced directives List of other physicians Hospitalizations, surgeries, and ER visits in previous 12 months Vitals Screenings to include cognitive, depression, and falls Referrals and appointments  In addition, I have reviewed and discussed with patient certain preventive protocols, quality metrics, and best practice recommendations. A written personalized care plan for preventive services as well as general preventive health recommendations were provided to patient.     VDebbora Dus COregon  02/04/2021   Nurse Notes: successful.

## 2021-02-10 ENCOUNTER — Encounter: Payer: Self-pay | Admitting: Family Medicine

## 2021-02-10 ENCOUNTER — Ambulatory Visit (INDEPENDENT_AMBULATORY_CARE_PROVIDER_SITE_OTHER): Payer: Medicare PPO | Admitting: Family Medicine

## 2021-02-10 ENCOUNTER — Other Ambulatory Visit: Payer: Self-pay

## 2021-02-10 VITALS — BP 130/70 | HR 61 | Temp 97.3°F | Ht 63.5 in | Wt 181.0 lb

## 2021-02-10 DIAGNOSIS — Z23 Encounter for immunization: Secondary | ICD-10-CM

## 2021-02-10 DIAGNOSIS — I10 Essential (primary) hypertension: Secondary | ICD-10-CM

## 2021-02-10 DIAGNOSIS — Z6831 Body mass index (BMI) 31.0-31.9, adult: Secondary | ICD-10-CM

## 2021-02-10 DIAGNOSIS — Z0000 Encounter for general adult medical examination without abnormal findings: Secondary | ICD-10-CM

## 2021-02-10 DIAGNOSIS — E6609 Other obesity due to excess calories: Secondary | ICD-10-CM

## 2021-02-10 DIAGNOSIS — E782 Mixed hyperlipidemia: Secondary | ICD-10-CM

## 2021-02-10 DIAGNOSIS — Z9884 Bariatric surgery status: Secondary | ICD-10-CM

## 2021-02-10 NOTE — Addendum Note (Signed)
Addended by: Carter Kitten on: 02/10/2021 02:53 PM   Modules accepted: Orders

## 2021-02-10 NOTE — Addendum Note (Signed)
Addended by: Tammi Sou on: 02/10/2021 02:58 PM   Modules accepted: Orders

## 2021-02-10 NOTE — Assessment & Plan Note (Signed)
Essentially resolved with weight loss!

## 2021-02-10 NOTE — Assessment & Plan Note (Signed)
Due for re-eval.  Encouraged exercise, weight loss, healthy eating habits.

## 2021-02-10 NOTE — Assessment & Plan Note (Signed)
Encouraged exercise, weight loss, healthy eating habits. ? ?

## 2021-02-10 NOTE — Progress Notes (Signed)
Patient ID: Elizabeth Lowery, female    DOB: Dec 23, 1955, 65 y.o.   MRN: [id number]  This visit was conducted in person.  BP 130/70   Pulse 61   Temp (!) 97.3 F (36.3 C) (Temporal)   Ht 5' 3.5" (1.613 m)   Wt 181 lb (82.1 kg)   SpO2 98%   BMI 31.56 kg/m    CC: Chief Complaint  Patient presents with   Annual Exam    Part 2    Subjective:   HPI: Elizabeth Lowery is a 65 y.o. female presenting on 02/10/2021 for Annual Exam (Part 2)   The patient presents for annual medicare wellness, complete physical and review of chronic health problems. He/She also has the following acute concerns today:  The patient saw a LPN or RN for medicare wellness visit.  Prevention and wellness was reviewed in detail. Note reviewed.  Elevated Cholesterol:  Due for re-eval cholesterol Using medications without problems: Muscle aches:  Diet compliance: She is eating smaller meals each day, healthy choices. Exercise: walking 4-5 times a week. Other complaints:  Hypertension:   Good  control on no medication. BP Readings from Last 3 Encounters:  02/10/21 130/70  02/04/21 131/70  02/13/19 130/82  Using medication without problems or lightheadedness: none Chest pain with exertion:none Edema:none Short of breath:none Average home BPs: Other issues:     Wt Readings from Last 3 Encounters:  02/10/21 181 lb (82.1 kg)  02/04/21 185 lb (83.9 kg)  02/13/19 200 lb 8 oz (90.9 kg)     Relevant past medical, surgical, family and social history reviewed and updated as indicated. Interim medical history since our last visit reviewed. Allergies and medications reviewed and updated. Outpatient Medications Prior to Visit  Medication Sig Dispense Refill   acetaminophen (TYLENOL) 500 MG tablet Take 500 mg by mouth every 6 (six) hours as needed.     BIOTIN PO Take 1,000 mcg by mouth daily.     Calcium Carbonate (CALCIUM 500 PO) Take 3 tablets by mouth daily.     cholecalciferol (VITAMIN D) 1000 UNITS  tablet Take 2,000 Units by mouth daily.      levocetirizine (XYZAL) 5 MG tablet Take 5 mg by mouth every evening.     Multiple Vitamin (MULTIVITAMIN) tablet Take 1 tablet by mouth daily.      Omega-3 Fatty Acids (FISH OIL) 1000 MG CAPS Take 1 capsule by mouth daily.     vitamin B-12 (CYANOCOBALAMIN) 1000 MCG tablet Take 1,000 mcg by mouth daily.     azelastine (OPTIVAR) 0.05 % ophthalmic solution Place 1 drop into both eyes 2 (two) times daily. (Patient not taking: Reported on 02/04/2021) 6 mL 12   cephALEXin (KEFLEX) 500 MG capsule Take 1 capsule (500 mg total) by mouth 2 (two) times daily. (Patient not taking: Reported on 02/04/2021) 14 capsule 0   ipratropium (ATROVENT) 0.03 % nasal spray Place 2 sprays into both nostrils every 12 (twelve) hours. 30 mL 0   Melatonin 3 MG TABS Take 2 tablets by mouth at bedtime as needed. Reported on 06/17/2015 (Patient not taking: Reported on 02/04/2021)     nitrofurantoin, macrocrystal-monohydrate, (MACROBID) 100 MG capsule Take 1 capsule (100 mg total) by mouth 2 (two) times daily. (Patient not taking: Reported on 02/04/2021) 10 capsule 0   No facility-administered medications prior to visit.     Per HPI unless specifically indicated in ROS section below Review of Systems Objective:  BP 130/70   Pulse 61  Temp (!) 97.3 F (36.3 C) (Temporal)   Ht 5' 3.5" (1.613 m)   Wt 181 lb (82.1 kg)   SpO2 98%   BMI 31.56 kg/m   Wt Readings from Last 3 Encounters:  02/10/21 181 lb (82.1 kg)  02/04/21 185 lb (83.9 kg)  02/13/19 200 lb 8 oz (90.9 kg)      Physical Exam    Results for orders placed or performed in visit on 07/26/20  [facility] MAMMOGRAPHY  Result Value Ref Range   HM Mammogram 0-4 Bi-Rad 0-4 Bi-Rad, Self Reported Normal    This visit occurred during the SARS-CoV-2 public health emergency.  Safety protocols were in place, including screening questions prior to the visit, additional usage of staff PPE, and extensive cleaning of exam room while  observing appropriate contact time as indicated for disinfecting solutions.   COVID 19 screen:  No recent travel or known exposure to COVID19 The patient denies respiratory symptoms of COVID 19 at this time. The importance of social distancing was discussed today.   Assessment and Plan The patient's preventative maintenance and recommended screening tests for an annual wellness exam were reviewed in full today. Brought up to date unless services declined.  Counselled on the importance of diet, exercise, and its role in overall health and mortality. The patient's FH and SH was reviewed, including their home life, tobacco status, and drug and alcohol status.    PAP, every 5 years, Last done 2019 no further indicated. Mammo 07/2020 Colon, Carlean Purl,  nml repeat 2027   Vaccines:  Due for pNA 21 and high dose flu, s/p COVID x 4,  She had shingrix 2022 per report DEXA: 06/2017 nml repeat in 5 years Hep C:  neg HIV: refused.   Problem List Items Addressed This Visit     Class 1 obesity with serious comorbidity and body mass index (BMI) of 31.0 to 31.9 in adult    Encouraged exercise, weight loss, healthy eating habits.       History of laparoscopic adjustable gastric banding, 04/24/2011.   Relevant Orders   CBC with Differential/Platelet   Vitamin B12   VITAMIN D 25 Hydroxy (Vit-D Deficiency, Fractures)   Hyperlipidemia     Due for re-eval.  Encouraged exercise, weight loss, healthy eating habits.       Hypertension    Essentially resolved with weight loss!      Other Visit Diagnoses     Routine general medical examination at a health care facility    -  Primary        Body mass index is 31.56 kg/m.     Eliezer Lofts, MD

## 2021-02-10 NOTE — Patient Instructions (Addendum)
Please stop at the lab to have labs drawn.  Keep up the great work on healthy lifestyle!

## 2021-02-11 LAB — CBC WITH DIFFERENTIAL/PLATELET
Absolute Monocytes: 439 cells/uL (ref 200–950)
Basophils Absolute: 31 cells/uL (ref 0–200)
Basophils Relative: 0.5 %
Eosinophils Absolute: 73 cells/uL (ref 15–500)
Eosinophils Relative: 1.2 %
HCT: 45.6 % — ABNORMAL HIGH (ref 35.0–45.0)
Hemoglobin: 15.2 g/dL (ref 11.7–15.5)
Lymphs Abs: 1708 cells/uL (ref 850–3900)
MCH: 30 pg (ref 27.0–33.0)
MCHC: 33.3 g/dL (ref 32.0–36.0)
MCV: 90.1 fL (ref 80.0–100.0)
MPV: 11.4 fL (ref 7.5–12.5)
Monocytes Relative: 7.2 %
Neutro Abs: 3849 cells/uL (ref 1500–7800)
Neutrophils Relative %: 63.1 %
Platelets: 196 10*3/uL (ref 140–400)
RBC: 5.06 10*6/uL (ref 3.80–5.10)
RDW: 13.7 % (ref 11.0–15.0)
Total Lymphocyte: 28 %
WBC: 6.1 10*3/uL (ref 3.8–10.8)

## 2021-02-11 LAB — LIPID PANEL
Cholesterol: 213 mg/dL — ABNORMAL HIGH (ref <200)
HDL: 52 mg/dL (ref >=50)
LDL Cholesterol (Calc): 142 mg/dL (calc) — ABNORMAL HIGH
Non-HDL Cholesterol (Calc): 161 mg/dL (calc) — ABNORMAL HIGH (ref <130)
Total CHOL/HDL Ratio: 4.1 (calc) (ref <5.0)
Triglycerides: 84 mg/dL (ref <150)

## 2021-02-11 LAB — VITAMIN B12: Vitamin B-12: 1587 pg/mL — ABNORMAL HIGH (ref 200–1100)

## 2021-02-11 LAB — COMPREHENSIVE METABOLIC PANEL
AG Ratio: 1.7 (calc) (ref 1.0–2.5)
ALT: 15 U/L (ref 6–29)
AST: 18 U/L (ref 10–35)
Albumin: 4.2 g/dL (ref 3.6–5.1)
Alkaline phosphatase (APISO): 52 U/L (ref 37–153)
BUN: 17 mg/dL (ref 7–25)
CO2: 23 mmol/L (ref 20–32)
Calcium: 9.4 mg/dL (ref 8.6–10.4)
Chloride: 104 mmol/L (ref 98–110)
Creat: 0.88 mg/dL (ref 0.50–1.05)
Globulin: 2.5 g/dL (calc) (ref 1.9–3.7)
Glucose, Bld: 76 mg/dL (ref 65–99)
Potassium: 4.1 mmol/L (ref 3.5–5.3)
Sodium: 140 mmol/L (ref 135–146)
Total Bilirubin: 0.7 mg/dL (ref 0.2–1.2)
Total Protein: 6.7 g/dL (ref 6.1–8.1)

## 2021-02-11 LAB — VITAMIN D 25 HYDROXY (VIT D DEFICIENCY, FRACTURES): Vit D, 25-Hydroxy: 68 ng/mL (ref 30–100)

## 2021-06-07 ENCOUNTER — Telehealth: Payer: Medicare PPO | Admitting: Family

## 2021-06-07 DIAGNOSIS — R399 Unspecified symptoms and signs involving the genitourinary system: Secondary | ICD-10-CM | POA: Diagnosis not present

## 2021-06-07 MED ORDER — CEPHALEXIN 500 MG PO CAPS: 500.0000 mg | ORAL_CAPSULE | Freq: Two times a day (BID) | ORAL | 0 refills | Status: DC

## 2021-06-07 NOTE — Progress Notes (Signed)

## 2021-09-11 ENCOUNTER — Telehealth: Payer: Medicare PPO | Admitting: Physician Assistant

## 2021-09-11 DIAGNOSIS — J069 Acute upper respiratory infection, unspecified: Secondary | ICD-10-CM | POA: Diagnosis not present

## 2021-09-12 MED ORDER — BENZONATATE 100 MG PO CAPS: 100.0000 mg | ORAL_CAPSULE | Freq: Three times a day (TID) | ORAL | 0 refills | Status: DC | PRN

## 2021-09-12 MED ORDER — FLUTICASONE PROPIONATE 50 MCG/ACT NA SUSP: 2.0000 | Freq: Every day | NASAL | 0 refills | Status: AC

## 2021-09-12 NOTE — Progress Notes (Signed)

## 2021-09-12 NOTE — Progress Notes (Signed)
I have spent 5 minutes in review of e-visit questionnaire, review and updating patient chart, medical decision making and response to patient.   Valgene Deloatch Cody Alencia Gordon, PA-C    

## 2021-10-18 DIAGNOSIS — Z1231 Encounter for screening mammogram for malignant neoplasm of breast: Secondary | ICD-10-CM | POA: Diagnosis not present

## 2021-11-03 ENCOUNTER — Telehealth: Payer: Medicare PPO | Admitting: Physician Assistant

## 2021-11-03 DIAGNOSIS — R3989 Other symptoms and signs involving the genitourinary system: Secondary | ICD-10-CM | POA: Diagnosis not present

## 2021-11-03 MED ORDER — CEPHALEXIN 500 MG PO CAPS: 500.0000 mg | ORAL_CAPSULE | Freq: Two times a day (BID) | ORAL | 0 refills | Status: DC

## 2021-11-03 NOTE — Progress Notes (Signed)

## 2022-01-18 ENCOUNTER — Telehealth: Payer: Self-pay | Admitting: Family Medicine

## 2022-01-18 NOTE — Telephone Encounter (Signed)
Left message for patient to call back and schedule Medicare Annual Wellness Visit (AWV).   Please offer to do virtually or by telephone.   Last AWV: 02/04/2021  Please schedule at anytime with [facility] Advisor schedule   45 minute appointent  If any questions, please contact me at [phone]

## 2022-02-05 ENCOUNTER — Ambulatory Visit (INDEPENDENT_AMBULATORY_CARE_PROVIDER_SITE_OTHER): Payer: Medicare PPO

## 2022-02-05 VITALS — Ht 63.5 in | Wt 181.0 lb

## 2022-02-05 DIAGNOSIS — Z0000 Encounter for general adult medical examination without abnormal findings: Secondary | ICD-10-CM

## 2022-02-05 NOTE — Progress Notes (Signed)
Virtual Visit via Telephone Note  I connected with  Elizabeth Lowery on 02/05/22 at  9:45 AM EDT by telephone and verified that I am speaking with the correct person using two identifiers.  Location: Patient: home Provider: [place] Persons participating in the virtual visit: [place]   I discussed the limitations, risks, security and privacy concerns of performing an evaluation and management service by telephone and the availability of in person appointments. The patient expressed understanding and agreed to proceed.  Interactive audio and video telecommunications were attempted between this nurse and patient, however failed, due to patient having technical difficulties OR patient did not have access to video capability.  We continued and completed visit with audio only.  Some vital signs may be absent or patient reported.   Dionisio David, LPN  Subjective:   Elizabeth Lowery is a 66 y.o. female who presents for Medicare Annual (Subsequent) preventive examination.  Review of Systems     Cardiac Risk Factors include: advanced age (>77mn, >>10women)     Objective:    There were no vitals filed for this visit. There is no height or weight on file to calculate BMI.     02/05/2022    9:47 AM 08/25/2015    7:38 AM 04/18/2011    1:27 PM  Advanced Directives  Does Patient Have a Medical Advance Directive? No No Patient would not like information;Patient does not have advance directive  Would patient like information on creating a medical advance directive? No - Patient declined No - patient declined information     Current Medications (verified) Outpatient Encounter Medications as of 02/05/2022  Medication Sig   acetaminophen (TYLENOL) 500 MG tablet Take 500 mg by mouth every 6 (six) hours as needed.   Calcium Carbonate (CALCIUM 500 PO) Take 3 tablets by mouth daily.   cholecalciferol (VITAMIN D) 1000 UNITS tablet Take 2,000 Units by mouth daily.     fluticasone (FLONASE) 50 MCG/ACT nasal spray Place 2 sprays into both nostrils daily.   levocetirizine (XYZAL) 5 MG tablet Take 5 mg by mouth every evening.   Multiple Vitamin (MULTIVITAMIN) tablet Take 1 tablet by mouth daily.    Omega-3 Fatty Acids (FISH OIL) 1000 MG CAPS Take 1 capsule by mouth daily.   vitamin B-12 (CYANOCOBALAMIN) 1000 MCG tablet Take 1,000 mcg by mouth daily.   benzonatate (TESSALON) 100 MG capsule Take 1 capsule (100 mg total) by mouth 3 (three) times daily as needed for cough. (Patient not taking: Reported on 02/05/2022)   BIOTIN PO Take 1,000 mcg by mouth daily. (Patient not taking: Reported on 02/05/2022)   cephALEXin (KEFLEX) 500 MG capsule Take 1 capsule (500 mg total) by mouth 2 (two) times daily. (Patient not taking: Reported on 02/05/2022)   No facility-administered encounter medications on file as of 02/05/2022.    Allergies (verified) Patient has no known allergies.   History: Past Medical History:  Diagnosis Date   Arthritis    osteoarthritis   Hyperlipidemia    Menopause    Morbid [place]    PONV (postoperative nausea and vomiting)    RUQ pain 2012   ruq pain with fatty lover of u/s, no gallstones and normal HIDA scan   Seasonal allergies    Sinus problem    Past Surgical History:  Procedure Laterality Date   JOINT REPLACEMENT  2010, 2011   right on 12/2008, left on 07/2009   LAPAROSCOPIC GASTRIC BANDING  04/24/2011   Procedure: LAPAROSCOPIC  GASTRIC BANDING;  Surgeon: Shann Medal, MD;  Location: [facility];  Service: General;  Laterality: N/A;  with Mesh under port   TOTAL HIP ARTHROPLASTY  2010, 2012   Bilateral   TUBAL LIGATION     Family History  Problem Relation Age of Onset   Lymphoma Father    Lung cancer Father    Cancer Father 102       lymphoma, lung   Osteoarthritis Father    Hyperlipidemia Mother    Osteoporosis Mother    Cancer Mother    Other Mother        spinal stenosis   Diabetes Brother    Other Brother         morbid obesity, lung issues   Osteoarthritis Brother    Sleep apnea Brother    Osteoarthritis Sister    Hyperlipidemia Sister    Hypertension Sister    Other Sister        morbid obesity   Colon cancer Maternal Grandmother    Cancer Maternal Grandmother        colon   Coronary artery disease Paternal Grandmother    Social History   Socioeconomic History   Marital status: Divorced    Spouse name: Not on file   Number of children: 1   Years of education: Not on file   Highest education level: Not on file  Occupational History   Occupation: Retired from Consulting civil engineer: RETIRED   Occupation: rental properties    Comment: part time  Tobacco Use   Smoking status: Never   Smokeless tobacco: Never  Substance and Sexual Activity   Alcohol use: Yes    Alcohol/week: 1.0 standard drink of alcohol    Types: 1 Glasses of wine per week    Comment: 1-2 wine on weekends   Drug use: No   Sexual activity: Yes  Other Topics Concern   Not on file  Social History Narrative   Regular exercise: yes, but more limited due to heel painDiet:ruits and veggies.   Social Determinants of Health   Financial Resource Strain: Low Risk  (02/05/2022)   Overall Financial Resource Strain (CARDIA)    Difficulty of Paying Living Expenses: Not hard at all  Food Insecurity: No Food Insecurity (02/05/2022)   Hunger Vital Sign    Worried About Running Out of Food in the Last Year: Never true    Ran Out of Food in the Last Year: Never true  Transportation Needs: No Transportation Needs (02/05/2022)   PRAPARE - Hydrologist (Medical): No    Lack of Transportation (Non-Medical): No  Physical Activity: Sufficiently Active (02/05/2022)   Exercise Vital Sign    Days of Exercise per Week: 5 days    Minutes of Exercise per Session: 60 min  Stress: No Stress Concern Present (02/05/2022)   [place]    Feeling of Stress  : Not at all  Social Connections: Moderately Isolated (02/05/2022)   Social Connection and Isolation Panel [NHANES]    Frequency of Communication with Friends and Family: More than three times a week    Frequency of Social Gatherings with Friends and Family: Once a week    Attends Religious Services: More than 4 times per year    Active Member of [employer] or Organizations: No    Attends Archivist Meetings: Never    Marital Status: Divorced    Tobacco Counseling Counseling given:  Not Answered   Clinical Intake:  Pre-visit preparation completed: Yes  Pain : No/denies pain     Nutritional Risks: None Diabetes: No  How often do you need to have someone help you when you read instructions, pamphlets, or other written materials from your doctor or pharmacy?: 1 - Never  Diabetic?no  Interpreter Needed?: No  Information entered by :: Kirke Shaggy, LPN   Activities of Daily Living    02/05/2022    9:48 AM  In your present state of health, do you have any difficulty performing the following activities:  Hearing? 0  Vision? 0  Difficulty concentrating or making decisions? 0  Walking or climbing stairs? 0  Dressing or bathing? 0  Doing errands, shopping? 0  Preparing Food and eating ? N  Using the Toilet? N  In the past six months, have you accidently leaked urine? N  Do you have problems with loss of bowel control? N  Managing your Medications? N  Managing your Finances? N  Housekeeping or managing your Housekeeping? N    Patient Care Team: Jinny Sanders, MD as PCP - General Paralee Cancel, MD as Consulting Physician (Orthopedic Surgery) Malena Edman, RD as Diabetes Educator Arbutus Leas, PhD as Consulting Physician (Psychology)  Indicate any recent Medical Services you may have received from other than [facility] providers in the past year (date may be approximate).     Assessment:   This is a routine wellness examination for Elizabeth Lowery.  Hearing/Vision  screen Hearing Screening - Comments:: No aids Vision Screening - Comments:: Readers-   Dietary issues and exercise activities discussed: Current Exercise Habits: Home exercise routine, Type of exercise: walking, Time (Minutes): 60, Frequency (Times/Week): 5, Weekly Exercise (Minutes/Week): 300, Intensity: Mild   Goals Addressed             This Visit's Progress    DIET - EAT MORE FRUITS AND VEGETABLES         Depression Screen    02/05/2022    9:46 AM 02/04/2021   10:17 AM 02/04/2021   10:15 AM 02/13/2019    3:17 PM 06/18/2017    7:43 AM  PHQ 2/9 Scores  PHQ - 2 Score 0 0 0 0 0  PHQ- 9 Score 0        Fall Risk    02/05/2022    9:48 AM 02/04/2021   10:17 AM 02/13/2019    3:17 PM  Fall Risk   Falls in the past year? 0 0 0  Number falls in past yr: 0 0   Injury with Fall? 0 0   Risk for fall due to : No Fall Risks No Fall Risks   Follow up Falls prevention discussed;Falls evaluation completed Falls evaluation completed     FALL RISK PREVENTION PERTAINING TO THE HOME:  Any stairs in or around the home? No  If so, are there any without handrails? No  Home free of loose throw rugs in walkways, pet beds, electrical cords, etc? Yes  Adequate lighting in your home to reduce risk of falls? Yes   ASSISTIVE DEVICES UTILIZED TO PREVENT FALLS:  Life alert? No  Use of a cane, walker or w/c? No  Grab bars in the bathroom? Yes  Shower chair or bench in shower? No  Elevated toilet seat or a handicapped toilet? Yes   Cognitive Function:        02/05/2022    9:49 AM 02/04/2021   10:17 AM  6CIT Screen  What Year? 0  points 0 points  What month? 0 points 0 points  What time? 0 points 0 points  Count back from 20 0 points 0 points  Months in reverse 0 points 0 points  Repeat phrase 0 points 2 points  Total Score 0 points 2 points    Immunizations Immunization History  Administered Date(s) Administered   Fluad Quad(high Dose 65+) 02/10/2021   Influenza Inj Mdck Quad Pf  03/06/2018   Influenza Split 03/07/2011, 03/04/2012   Influenza Whole 07/19/2009   Influenza,inj,Quad PF,6+ Mos 03/04/2013, 01/19/2014, 02/16/2015, 03/07/2016, 02/13/2019   Influenza-Unspecified 02/24/2017, 01/26/2020, 01/17/2022   PFIZER(Purple Top)SARS-COV-2 Vaccination 07/04/2019, 07/29/2019, 03/11/2020, 12/27/2020   PPD Test 02/16/2015   Pneumococcal Conjugate-13 02/13/2019   Pneumococcal Polysaccharide-23 02/10/2021   Td 05/21/2006   Tdap 06/18/2017   Zoster Recombinat (Shingrix) 02/11/2020, 05/26/2020   Zoster, Live 01/19/2014    TDAP status: Up to date  Flu Vaccine status: Up to date  Pneumococcal vaccine status: Up to date  Covid-19 vaccine status: Completed vaccines  Qualifies for Shingles Vaccine? Yes   Zostavax completed Yes   Shingrix Completed?: Yes  Screening Tests Health Maintenance  Topic Date Due   COVID-19 Vaccine (5 - Pfizer series) 02/21/2021   DEXA SCAN  06/28/2022   MAMMOGRAM  07/23/2022   COLONOSCOPY (Pts 45-65yr Insurance coverage will need to be confirmed)  08/24/2025   TETANUS/TDAP  06/19/2027   Pneumonia Vaccine 69 Years old  Completed   INFLUENZA VACCINE  Completed   Hepatitis C Screening  Completed   Zoster Vaccines- Shingrix  Completed   HPV VACCINES  Aged Out    Health Maintenance  Health Maintenance Due  Topic Date Due   COVID-19 Vaccine (5 - Pfizer series) 02/21/2021    Colorectal cancer screening: Type of screening: Colonoscopy. Completed 08/25/15. Repeat every 10 years  Mammogram status: Completed 07/22/20. Repeat every year- just had another mammogram in July   Bone Density status: Completed 06/28/17. Results reflect: Bone density results: NORMAL. Repeat every 5 years.  Lung Cancer Screening: (Low Dose CT Chest recommended if Age 68-80years, 30 pack-year currently smoking OR have quit w/in 15years.) does not qualify.   Additional Screening:  Hepatitis C Screening: does qualify; Completed 04/16/16  Vision Screening:  Recommended annual ophthalmology exams for early detection of glaucoma and other disorders of the eye. Is the patient up to date with their annual eye exam?  No  Who is the provider or what is the name of the office in which the patient attends annual eye exams? No one If pt is not established with a provider, would they like to be referred to a provider to establish care? No .   Dental Screening: Recommended annual dental exams for proper oral hygiene  Community Resource Referral / Chronic Care Management: CRR required this visit?  No   CCM required this visit?  No      Plan:     I have personally reviewed and noted the following in the patient's chart:   Medical and social history Use of alcohol, tobacco or illicit drugs  Current medications and supplements including opioid prescriptions. Patient is not currently taking opioid prescriptions. Functional ability and status Nutritional status Physical activity Advanced directives List of other physicians Hospitalizations, surgeries, and ER visits in previous 12 months Vitals Screenings to include cognitive, depression, and falls Referrals and appointments  In addition, I have reviewed and discussed with patient certain preventive protocols, quality metrics, and best practice recommendations. A written personalized care plan for preventive  services as well as general preventive health recommendations were provided to patient.     Dionisio David, LPN   [id number]   Nurse Notes: none

## 2022-02-05 NOTE — Patient Instructions (Signed)
Ms. Elizabeth Lowery , Thank you for taking time to come for your Medicare Wellness Visit. I appreciate your ongoing commitment to your health goals. Please review the following plan we discussed and let me know if I can assist you in the future.   Screening recommendations/referrals: Colonoscopy: 08/25/15, 10 years Mammogram: 07/22/20, pt states had in July 2023 at [facility]: 06/28/17 Recommended yearly ophthalmology/optometry visit for glaucoma screening and checkup Recommended yearly dental visit for hygiene and checkup  Vaccinations: Influenza vaccine: 01/17/22 Pneumococcal vaccine: 02/10/21 Tdap vaccine: 06/18/17 Shingles vaccine: Zostavax 01/19/14   Shingrix 02/11/20, 05/26/20   Covid-19:07/04/19, 07/29/19, 03/11/20, 12/27/20  Advanced directives: no  Conditions/risks identified: none  Next appointment: Follow up in one year for your annual wellness visit - 01/1923  @ 9 am by phone   Preventive Care 65 Years and Older, Female Preventive care refers to lifestyle choices and visits with your health care provider that can promote health and wellness. What does preventive care include? A yearly physical exam. This is also called an annual well check. Dental exams once or twice a year. Routine eye exams. Ask your health care provider how often you should have your eyes checked. Personal lifestyle choices, including: Daily care of your teeth and gums. Regular physical activity. Eating a healthy diet. Avoiding tobacco and drug use. Limiting alcohol use. Practicing safe sex. Taking low-dose aspirin every day. Taking vitamin and mineral supplements as recommended by your health care provider. What happens during an annual well check? The services and screenings done by your health care provider during your annual well check will depend on your age, overall health, lifestyle risk factors, and family history of disease. Counseling  Your health care provider may ask you questions about your: Alcohol  use. Tobacco use. Drug use. Emotional well-being. Home and relationship well-being. Sexual activity. Eating habits. History of falls. Memory and ability to understand (cognition). Work and work Statistician. Reproductive health. Screening  You may have the following tests or measurements: Height, weight, and BMI. Blood pressure. Lipid and cholesterol levels. These may be checked every 5 years, or more frequently if you are over 17 years old. Skin check. Lung cancer screening. You may have this screening every year starting at age 43 if you have a 30-pack-year history of smoking and currently smoke or have quit within the past 15 years. Fecal occult blood test (FOBT) of the stool. You may have this test every year starting at age 49. Flexible sigmoidoscopy or colonoscopy. You may have a sigmoidoscopy every 5 years or a colonoscopy every 10 years starting at age 25. Hepatitis C blood test. Hepatitis B blood test. Sexually transmitted disease (STD) testing. Diabetes screening. This is done by checking your blood sugar (glucose) after you have not eaten for a while (fasting). You may have this done every 1-3 years. Bone density scan. This is done to screen for osteoporosis. You may have this done starting at age 31. Mammogram. This may be done every 1-2 years. Talk to your health care provider about how often you should have regular mammograms. Talk with your health care provider about your test results, treatment options, and if necessary, the need for more tests. Vaccines  Your health care provider may recommend certain vaccines, such as: Influenza vaccine. This is recommended every year. Tetanus, diphtheria, and acellular pertussis (Tdap, Td) vaccine. You may need a Td booster every 10 years. Zoster vaccine. You may need this after age 52. Pneumococcal 13-valent conjugate (PCV13) vaccine. One dose is recommended  after age 26. Pneumococcal polysaccharide (PPSV23) vaccine. One dose is  recommended after age 80. Talk to your health care provider about which screenings and vaccines you need and how often you need them. This information is not intended to replace advice given to you by your health care provider. Make sure you discuss any questions you have with your health care provider. Document Released: 06/03/2015 Document Revised: 01/25/2016 Document Reviewed: 03/08/2015 Elsevier Interactive Patient Education  2017 [employer] Prevention in the Home Falls can cause injuries. They can happen to people of all ages. There are many things you can do to make your home safe and to help prevent falls. What can I do on the outside of my home? Regularly fix the edges of walkways and driveways and fix any cracks. Remove anything that might make you trip as you walk through a door, such as a raised step or threshold. Trim any bushes or trees on the path to your home. Use bright outdoor lighting. Clear any walking paths of anything that might make someone trip, such as rocks or tools. Regularly check to see if handrails are loose or broken. Make sure that both sides of any steps have handrails. Any raised decks and porches should have guardrails on the edges. Have any leaves, snow, or ice cleared regularly. Use sand or salt on walking paths during winter. Clean up any spills in your garage right away. This includes oil or grease spills. What can I do in the bathroom? Use night lights. Install grab bars by the toilet and in the tub and shower. Do not use towel bars as grab bars. Use non-skid mats or decals in the tub or shower. If you need to sit down in the shower, use a plastic, non-slip stool. Keep the floor dry. Clean up any water that spills on the floor as soon as it happens. Remove soap buildup in the tub or shower regularly. Attach bath mats securely with double-sided non-slip rug tape. Do not have throw rugs and other things on the floor that can make you  trip. What can I do in the bedroom? Use night lights. Make sure that you have a light by your bed that is easy to reach. Do not use any sheets or blankets that are too big for your bed. They should not hang down onto the floor. Have a firm chair that has side arms. You can use this for support while you get dressed. Do not have throw rugs and other things on the floor that can make you trip. What can I do in the kitchen? Clean up any spills right away. Avoid walking on wet floors. Keep items that you use a lot in easy-to-reach places. If you need to reach something above you, use a strong step stool that has a grab bar. Keep electrical cords out of the way. Do not use floor polish or wax that makes floors slippery. If you must use wax, use non-skid floor wax. Do not have throw rugs and other things on the floor that can make you trip. What can I do with my stairs? Do not leave any items on the stairs. Make sure that there are handrails on both sides of the stairs and use them. Fix handrails that are broken or loose. Make sure that handrails are as long as the stairways. Check any carpeting to make sure that it is firmly attached to the stairs. Fix any carpet that is loose or worn. Avoid having throw  rugs at the top or bottom of the stairs. If you do have throw rugs, attach them to the floor with carpet tape. Make sure that you have a light switch at the top of the stairs and the bottom of the stairs. If you do not have them, ask someone to add them for you. What else can I do to help prevent falls? Wear shoes that: Do not have high heels. Have rubber bottoms. Are comfortable and fit you well. Are closed at the toe. Do not wear sandals. If you use a stepladder: Make sure that it is fully opened. Do not climb a closed stepladder. Make sure that both sides of the stepladder are locked into place. Ask someone to hold it for you, if possible. Clearly mark and make sure that you can  see: Any grab bars or handrails. First and last steps. Where the edge of each step is. Use tools that help you move around (mobility aids) if they are needed. These include: Canes. Walkers. Scooters. Crutches. Turn on the lights when you go into a dark area. Replace any light bulbs as soon as they burn out. Set up your furniture so you have a clear path. Avoid moving your furniture around. If any of your floors are uneven, fix them. If there are any pets around you, be aware of where they are. Review your medicines with your doctor. Some medicines can make you feel dizzy. This can increase your chance of falling. Ask your doctor what other things that you can do to help prevent falls. This information is not intended to replace advice given to you by your health care provider. Make sure you discuss any questions you have with your health care provider. Document Released: 03/03/2009 Document Revised: 10/13/2015 Document Reviewed: 06/11/2014 Elsevier Interactive Patient Education  2017 [employer].

## 2022-05-12 ENCOUNTER — Telehealth: Payer: Self-pay | Admitting: Urgent Care

## 2022-05-12 DIAGNOSIS — N3000 Acute cystitis without hematuria: Secondary | ICD-10-CM

## 2022-05-12 MED ORDER — NITROFURANTOIN MONOHYD MACRO 100 MG PO CAPS: 100.0000 mg | ORAL_CAPSULE | Freq: Two times a day (BID) | ORAL | 0 refills | Status: AC

## 2022-05-12 NOTE — Progress Notes (Signed)
E-Visit for Urinary Problems  We are sorry that you are not feeling well.  Here is how we plan to help!  Based on what you shared with me it looks like you most likely have a simple urinary tract infection.  A UTI (Urinary Tract Infection) is a bacterial infection of the bladder.  Most cases of urinary tract infections are simple to treat but a key part of your care is to encourage you to drink plenty of fluids and watch your symptoms carefully.  I have prescribed MacroBid 100 mg twice a day for 5 days.  Your symptoms should gradually improve. Call us if the burning in your urine worsens, you develop worsening fever, back pain or pelvic pain or if your symptoms do not resolve after completing the antibiotic.  Urinary tract infections can be prevented by drinking plenty of water to keep your body hydrated.  Also be sure when you wipe, wipe from front to back and don't hold it in!  If possible, empty your bladder every 4 hours.  HOME CARE Drink plenty of fluids Compete the full course of the antibiotics even if the symptoms resolve Remember, when you need to go.go. Holding in your urine can increase the likelihood of getting a UTI! GET HELP RIGHT AWAY IF: You cannot urinate You get a high fever Worsening back pain occurs You see blood in your urine You feel sick to your stomach or throw up You feel like you are going to pass out  MAKE SURE YOU  Understand these instructions. Will watch your condition. Will get help right away if you are not doing well or get worse.   Thank you for choosing an e-visit.  Your e-visit answers were reviewed by a board certified advanced clinical practitioner to complete your personal care plan. Depending upon the condition, your plan could have included both over the counter or prescription medications.  Please review your pharmacy choice. Make sure the pharmacy is open so you can pick up prescription now. If there is a problem, you may contact your  provider through [employer] and have the prescription routed to another pharmacy.  Your safety is important to [place]. If you have drug allergies check your prescription carefully.   For the next 24 hours you can use MyChart to ask questions about today's visit, request a non-urgent call back, or ask for a work or school excuse. You will get an email in the next two days asking about your experience. I hope that your e-visit has been valuable and will speed your recovery.   I have spent 5 minutes in review of e-visit questionnaire, review and updating patient chart, medical decision making and response to patient.   [place], PA

## 2022-10-22 DIAGNOSIS — Z1231 Encounter for screening mammogram for malignant neoplasm of breast: Secondary | ICD-10-CM | POA: Diagnosis not present

## 2022-10-22 LAB — HM MAMMOGRAPHY

## 2022-10-23 ENCOUNTER — Encounter: Payer: Self-pay | Admitting: Family Medicine

## 2023-01-01 ENCOUNTER — Telehealth: Payer: Medicare PPO | Admitting: Nurse Practitioner

## 2023-01-01 DIAGNOSIS — L237 Allergic contact dermatitis due to plants, except food: Secondary | ICD-10-CM | POA: Diagnosis not present

## 2023-01-01 MED ORDER — PREDNISONE 10 MG PO TABS: ORAL_TABLET | ORAL | 0 refills | Status: DC

## 2023-01-01 NOTE — Progress Notes (Signed)
E-Visit for Poison Ivy  We are sorry that you are not feeing well.  Here is how we plan to help!  Based on what you have shared with me it looks like you have had an allergic reaction to the oily resin from a group of plants.  This resin is very sticky, so it easily attaches to your skin, clothing, tools equipment, and pet's fur.    This blistering rash is often called poison ivy rash although it can come from contact with the leaves, stems and roots of poison ivy, poison oak and poison sumac.  The oily resin contains urushiol (u-ROO-she-ol) that produces a skin rash on exposed skin.  The severity of the rash depends on the amount of urushiol that gets on your skin.  A section of skin with more urushiol on it may develop a rash sooner.  The rash usually develops 12-48 hours after exposure and can last two to three weeks.  Your skin must come in direct contact with the plant's oil to be affected.  Blister fluid doesn't spread the rash.  However, if you come into contact with a piece of clothing or pet fur that has urushiol on it, the rash may spread out.  You can also transfer the oil to other parts of your body with your fingers.  Often the rash looks like a straight line because of the way the plant brushes against your skin.  Since your rash is widespread or has resulted in a large number of blisters, I have prescribed an oral corticosteroid.  Please follow these recommendations:  I have sent a prednisone dose pack to your chosen pharmacy. Be sure to follow the instructions carefully and complete the entire prescription. You may use Benadryl or Caladryl topical lotions to sooth the itch and remember cool, not hot, showers and baths can help relieve the itching!  Place cool, wet compresses on the affected area for 15-30 minutes several times a day.  You may also take oral antihistamines, such as diphenhydramine (Benadryl, others), which may also help you sleep better.  Watch your skin for any purulent  (pus) drainage or red streaking from the site.  If this occurs, contact your provider.  You may require an antibiotic for a skin infection.  Make sure that the clothes you were wearing as well as any towels or sheets that may have come in contact with the oil (urushiol) are washed in detergent and hot water.       I have developed the following plan to treat your condition I am prescribing a two week course of steroids (37 tablets of 10 mg prednisone).  Days 1-4 take 4 tablets (40 mg) daily  Days 5-8 take 3 tablets (30 mg) daily, Days 9-11 take 2 tablets (20 mg) daily, Days 12-14 take 1 tablet (10 mg) daily.    What can you do to prevent this rash?  Avoid the plants.  Learn how to identify poison ivy, poison oak and poison sumac in all seasons.  When hiking or engaging in other activities that might expose you to these plants, try to stay on cleared pathways.  If camping, make sure you pitch your tent in an area free of these plants.  Keep pets from running through wooded areas so that urushiol doesn't accidentally stick to their fur, which you may touch.  Remove or kill the plants.  In your yard, you can get rid of poison ivy by applying an herbicide or pulling it out of   the ground, including the roots, while wearing heavy gloves.  Afterward remove the gloves and thoroughly wash them and your hands.  Don't burn poison ivy or related plants because the urushiol can be carried by smoke.  Wear protective clothing.  If needed, protect your skin by wearing socks, boots, pants, long sleeves and vinyl gloves.  Wash your skin right away.  Washing off the oil with soap and water within 30 minutes of exposure may reduce your chances of getting a poison ivy rash.  Even washing after an hour or so can help reduce the severity of the rash.  If you walk through some poison ivy and then later touch your shoes, you may get some urushiol on your hands, which may then transfer to your face or body by touching or  rubbing.  If the contaminated object isn't cleaned, the urushiol on it can still cause a skin reaction years later.    Be careful not to reuse towels after you have washed your skin.  Also carefully wash clothing in detergent and hot water to remove all traces of the oil.  Handle contaminated clothing carefully so you don't transfer the urushiol to yourself, furniture, rugs or appliances.  Remember that pets can carry the oil on their fur and paws.  If you think your pet may be contaminated with urushiol, put on some long rubber gloves and give your pet a bath.  Finally, be careful not to burn these plants as the smoke can contain traces of the oil.  Inhaling the smoke may result in difficulty breathing. If that occurred you should see a physician as soon as possible.  See your doctor right away if:  The reaction is severe or widespread You inhaled the smoke from burning poison ivy and are having difficulty breathing Your skin continues to swell The rash affects your eyes, mouth or genitals Blisters are oozing pus You develop a fever greater than 100 F (37.8 C) The rash doesn't get better within a few weeks.  If you scratch the poison ivy rash, bacteria under your fingernails may cause the skin to become infected.  See your doctor if pus starts oozing from the blisters.  Treatment generally includes antibiotics.  Poison ivy treatments are usually limited to self-care methods.  And the rash typically goes away on its own in two to three weeks.     If the rash is widespread or results in a large number of blisters, your doctor may prescribe an oral corticosteroid, such as prednisone.  If a bacterial infection has developed at the rash site, your doctor may give you a prescription for an oral antibiotic.  MAKE SURE YOU  Understand these instructions. Will watch your condition. Will get help right away if you are not doing well or get worse.   Thank you for choosing an e-visit.  Your  e-visit answers were reviewed by a board certified advanced clinical practitioner to complete your personal care plan. Depending upon the condition, your plan could have included both over the counter or prescription medications.  Please review your pharmacy choice. Make sure the pharmacy is open so you can pick up prescription now. If there is a problem, you may contact your provider through MyChart messaging and have the prescription routed to another pharmacy.  Your safety is important to us. If you have drug allergies check your prescription carefully.   For the next 24 hours you can use MyChart to ask questions about today's visit, request a non-urgent   call back, or ask for a work or school excuse. You will get an email in the next two days asking about your experience. I hope that your e-visit has been valuable and will speed your recovery.  Meds ordered this encounter  Medications   predniSONE (DELTASONE) 10 MG tablet    Sig: Take 4 tablets (40mg) on days 1-4, then 3 tablets (30mg) on days 5-8, then 2 tablets (20mg) on days 9-11, then 1 tablet daily for days 12-14. Take with food.    Dispense:  37 tablet    Refill:  0    I spent approximately 5 minutes reviewing the patient's history, current symptoms and coordinating their care today.   

## 2023-01-03 ENCOUNTER — Encounter (INDEPENDENT_AMBULATORY_CARE_PROVIDER_SITE_OTHER): Payer: Self-pay

## 2023-02-10 ENCOUNTER — Telehealth: Payer: Medicare PPO | Admitting: Family

## 2023-02-10 DIAGNOSIS — R6889 Other general symptoms and signs: Secondary | ICD-10-CM

## 2023-02-10 MED ORDER — ONDANSETRON HCL 4 MG PO TABS: 4.0000 mg | ORAL_TABLET | Freq: Three times a day (TID) | ORAL | 0 refills | Status: DC | PRN

## 2023-02-10 NOTE — Progress Notes (Signed)
E visit for Flu like symptoms   We are sorry that you are not feeling well.  Here is how we plan to help! Based on what you have shared with me it looks like you may have a respiratory virus that may be influenza.  Influenza or "the flu" is   an infection caused by a respiratory virus. The flu virus is highly contagious and persons who did not receive their yearly flu vaccination may "catch" the flu from close contact.  We have anti-viral medications to treat the viruses that cause this infection. They are not a "cure" and only shorten the course of the infection. These prescriptions are most effective when they are given within the first 2 days of "flu" symptoms. Antiviral medication are indicated if you have a high risk of complications from the flu. You should  also consider an antiviral medication if you are in close contact with someone who is at risk. These medications can help patients avoid complications from the flu  but have side effects that you should know. Possible side effects from Tamiflu or oseltamivir include nausea, vomiting, diarrhea, dizziness, headaches, eye redness, sleep problems or other respiratory symptoms. You should not take Tamiflu if you have an allergy to oseltamivir or any to the ingredients in Tamiflu.  Based upon your symptoms and potential risk factors I recommend that you follow the flu symptoms recommendation that I have listed below. I recommend tylenol as needed and will send in zofran for nausea. I do recommend you take a COVID test to rule out.   ANYONE WHO HAS FLU SYMPTOMS SHOULD: Stay home. The flu is highly contagious and going out or to work exposes others! Be sure to drink plenty of fluids. Water is fine as well as fruit juices, sodas and electrolyte beverages. You may want to stay away from caffeine or alcohol. If you are nauseated, try taking small sips of liquids. How do you know if you are getting enough fluid? Your urine should be a pale yellow or  almost colorless. Get rest. Taking a steamy shower or using a humidifier may help nasal congestion and ease sore throat pain. Using a saline nasal spray works much the same way. Cough drops, hard candies and sore throat lozenges may ease your cough. Line up a caregiver. Have someone check on you regularly.   GET HELP RIGHT AWAY IF: You cannot keep down liquids or your medications. You become short of breath Your fell like you are going to pass out or loose consciousness. Your symptoms persist after you have completed your treatment plan MAKE SURE YOU  Understand these instructions. Will watch your condition. Will get help right away if you are not doing well or get worse.  Your e-visit answers were reviewed by a board certified advanced clinical practitioner to complete your personal care plan.  Depending on the condition, your plan could have included both over the counter or prescription medications.  If there is a problem please reply  once you have received a response from your provider.  Your safety is important to [place].  If you have drug allergies check your prescription carefully.    You can use MyChart to ask questions about today's visit, request a non-urgent call back, or ask for a work or school excuse for 24 hours related to this e-Visit. If it has been greater than 24 hours you will need to follow up with your provider, or enter a new e-Visit to address those concerns.  You  will get an e-mail in the next two days asking about your experience.  I hope that your e-visit has been valuable and will speed your recovery. Thank you for using e-visits.   Approximately 5 minutes was spent documenting and reviewing patient's chart.

## 2023-02-12 ENCOUNTER — Ambulatory Visit (INDEPENDENT_AMBULATORY_CARE_PROVIDER_SITE_OTHER): Payer: Medicare PPO

## 2023-02-12 VITALS — Ht 63.0 in | Wt 185.0 lb

## 2023-02-12 DIAGNOSIS — Z0000 Encounter for general adult medical examination without abnormal findings: Secondary | ICD-10-CM

## 2023-02-12 DIAGNOSIS — Z13820 Encounter for screening for osteoporosis: Secondary | ICD-10-CM | POA: Diagnosis not present

## 2023-02-12 NOTE — Patient Instructions (Addendum)
Elizabeth Lowery , Thank you for taking time to come for your Medicare Wellness Visit. I appreciate your ongoing commitment to your health goals. Please review the following plan we discussed and let me know if I can assist you in the future.   Referrals/Orders/Follow-Ups/Clinician Recommendations:   This is a list of the screening recommended for you and due dates:  Health Maintenance  Topic Date Due   DEXA scan (bone density measurement)  06/28/2022   Flu Shot  12/20/2022   COVID-19 Vaccine (5 - 2023-24 season) 01/20/2023   Medicare Annual Wellness Visit  02/12/2024   Mammogram  10/21/2024   Colon Cancer Screening  08/24/2025   DTaP/Tdap/Td vaccine (3 - Td or Tdap) 06/19/2027   Pneumonia Vaccine  Completed   Hepatitis C Screening  Completed   Zoster (Shingles) Vaccine  Completed   HPV Vaccine  Aged Out    Advanced directives: (Declined) Advance directive discussed with you today. Even though you declined this today, please call our office should you change your mind, and we can give you the proper paperwork for you to fill out.  Next Medicare Annual Wellness Visit scheduled for next year: Yes

## 2023-02-12 NOTE — Progress Notes (Signed)
Subjective:   Elizabeth Lowery is a 67 y.o. female who presents for Medicare Annual (Subsequent) preventive examination.  Visit Complete: Virtual  I connected with  Jaci Carrel on 02/12/23 by a audio enabled telemedicine application and verified that I am speaking with the correct person using two identifiers.  Patient Location: Home  Provider Location: Home Office  I discussed the limitations of evaluation and management by telemedicine. The patient expressed understanding and agreed to proceed.  Vital Signs: Unable to obtain new vitals due to this being a telehealth visit.   Cardiac Risk Factors include: advanced age (>21men, >38 women);hypertension     Objective:    Today's Vitals   02/12/23 1447  Weight: 185 lb (83.9 kg)  Height: 5\' 3"  (1.6 m)   Body mass index is 32.77 kg/m.     02/12/2023    2:53 PM 02/05/2022    9:47 AM 08/25/2015    7:38 AM 04/18/2011    1:27 PM  Advanced Directives  Does Patient Have a Medical Advance Directive? No No No Patient would not like information;Patient does not have advance directive  Would patient like information on creating a medical advance directive? No - Patient declined No - Patient declined No - patient declined information     Current Medications (verified) Outpatient Encounter Medications as of 02/12/2023  Medication Sig   acetaminophen (TYLENOL) 500 MG tablet Take 500 mg by mouth every 6 (six) hours as needed.   BIOTIN PO Take 1,000 mcg by mouth daily. (Patient not taking: Reported on 02/05/2022)   Calcium Carbonate (CALCIUM 500 PO) Take 3 tablets by mouth daily.   cholecalciferol (VITAMIN D) 1000 UNITS tablet Take 2,000 Units by mouth daily.    fluticasone (FLONASE) 50 MCG/ACT nasal spray Place 2 sprays into both nostrils daily.   levocetirizine (XYZAL) 5 MG tablet Take 5 mg by mouth every evening.   Multiple Vitamin (MULTIVITAMIN) tablet Take 1 tablet by mouth daily.    Omega-3 Fatty Acids (FISH OIL) 1000 MG CAPS Take 1  capsule by mouth daily.   ondansetron (ZOFRAN) 4 MG tablet Take 1 tablet (4 mg total) by mouth every 8 (eight) hours as needed for nausea or vomiting.   predniSONE (DELTASONE) 10 MG tablet Take 4 tablets (40mg ) on days 1-4, then 3 tablets (30mg ) on days 5-8, then 2 tablets (20mg ) on days 9-11, then 1 tablet daily for days 12-14. Take with food.   vitamin B-12 (CYANOCOBALAMIN) 1000 MCG tablet Take 1,000 mcg by mouth daily.   No facility-administered encounter medications on file as of 02/12/2023.    Allergies (verified) Patient has no known allergies.   History: Past Medical History:  Diagnosis Date   Arthritis    osteoarthritis   Hyperlipidemia    Menopause    Morbid obesity (HCC)    PONV (postoperative nausea and vomiting)    RUQ pain 2012   ruq pain with fatty lover of u/s, no gallstones and normal HIDA scan   Seasonal allergies    Sinus problem    Past Surgical History:  Procedure Laterality Date   JOINT REPLACEMENT  2010, 2011   right on 12/2008, left on 07/2009   LAPAROSCOPIC GASTRIC BANDING  04/24/2011   Procedure: LAPAROSCOPIC GASTRIC BANDING;  Surgeon: Kandis Cocking, MD;  Location: [facility];  Service: General;  Laterality: N/A;  with Mesh under port   TOTAL HIP ARTHROPLASTY  2010, 2012   Bilateral   TUBAL LIGATION     Family History  Problem Relation Age of Onset   Lymphoma Father    Lung cancer Father    Cancer Father 62       lymphoma, lung   Osteoarthritis Father    Hyperlipidemia Mother    Osteoporosis Mother    Cancer Mother    Other Mother        spinal stenosis   Diabetes Brother    Other Brother        morbid obesity, lung issues   Osteoarthritis Brother    Sleep apnea Brother    Osteoarthritis Sister    Hyperlipidemia Sister    Hypertension Sister    Other Sister        morbid obesity   Colon cancer Maternal Grandmother    Cancer Maternal Grandmother        colon   Coronary artery disease Paternal Grandmother    Social History    Socioeconomic History   Marital status: Divorced    Spouse name: Not on file   Number of children: 1   Years of education: Not on file   Highest education level: Not on file  Occupational History   Occupation: Retired from [employer]: RETIRED   Occupation: rental properties    Comment: part time  Tobacco Use   Smoking status: Never   Smokeless tobacco: Never  Substance and Sexual Activity   Alcohol use: Yes    Alcohol/week: 1.0 standard drink of alcohol    Types: 1 Glasses of wine per week    Comment: 1-2 wine on weekends   Drug use: No   Sexual activity: Yes  Other Topics Concern   Not on file  Social History Narrative   Regular exercise: yes, but more limited due to heel painDiet:ruits and veggies.   Social Determinants of Health   Financial Resource Strain: Low Risk  (02/12/2023)   Overall Financial Resource Strain (CARDIA)    Difficulty of Paying Living Expenses: Not hard at all  Food Insecurity: No Food Insecurity (02/12/2023)   Hunger Vital Sign    Worried About Running Out of Food in the Last Year: Never true    Ran Out of Food in the Last Year: Never true  Transportation Needs: No Transportation Needs (02/12/2023)   PRAPARE - Administrator, Civil Service (Medical): No    Lack of Transportation (Non-Medical): No  Physical Activity: Sufficiently Active (02/12/2023)   Exercise Vital Sign    Days of Exercise per Week: 4 days    Minutes of Exercise per Session: 60 min  Stress: No Stress Concern Present (02/12/2023)   [employer] of Occupational Health - Occupational Stress Questionnaire    Feeling of Stress : Not at all  Social Connections: Moderately Integrated (02/12/2023)   Social Connection and Isolation Panel [NHANES]    Frequency of Communication with Friends and Family: More than three times a week    Frequency of Social Gatherings with Friends and Family: More than three times a week    Attends Religious Services: More than 4 times  per year    Active Member of [employer] or Organizations: Yes    Attends Engineer, structural: More than 4 times per year    Marital Status: Divorced    Tobacco Counseling Counseling given: Not Answered   Clinical Intake:  Pre-visit preparation completed: Yes  Pain : No/denies pain     BMI - recorded: 32.77 Nutritional Status: BMI > 30  Obese Nutritional Risks: None Diabetes: No  How often do you need to have someone help you when you read instructions, pamphlets, or other written materials from your doctor or pharmacy?: 1 - Never  Interpreter Needed?: No  Information entered by :: Theresa Mulligan LPN   Activities of Daily Living    02/12/2023    2:52 PM  In your present state of health, do you have any difficulty performing the following activities:  Hearing? 0  Vision? 0  Difficulty concentrating or making decisions? 0  Walking or climbing stairs? 0  Dressing or bathing? 0  Doing errands, shopping? 0  Preparing Food and eating ? N  Using the Toilet? N  In the past six months, have you accidently leaked urine? N  Do you have problems with loss of bowel control? N  Managing your Medications? N  Managing your Finances? N  Housekeeping or managing your Housekeeping? N    Patient Care Team: Excell Seltzer, MD as PCP - General Durene Romans, MD as Consulting Physician (Orthopedic Surgery) Fuller Plan, RD as Diabetes Educator Vilinda Flake, PhD as Consulting Physician (Psychology)  Indicate any recent Medical Services you may have received from other than [facility] providers in the past year (date may be approximate).     Assessment:   This is a routine wellness examination for Pagan.  Hearing/Vision screen Hearing Screening - Comments:: Denies hearing difficulties   Vision Screening - Comments:: Wears rx glasses - up to date with routine eye exams with  [facility]   Goals Addressed               This Visit's Progress     Lose weight.  (pt-stated)        Stay Healthy!       Depression Screen    02/12/2023    2:51 PM 02/05/2022    9:46 AM 02/04/2021   10:17 AM 02/04/2021   10:15 AM 02/13/2019    3:17 PM 06/18/2017    7:43 AM  PHQ 2/9 Scores  PHQ - 2 Score 0 0 0 0 0 0  PHQ- 9 Score  0        Fall Risk    02/12/2023    2:53 PM 02/05/2022    9:48 AM 02/04/2021   10:17 AM 02/13/2019    3:17 PM  Fall Risk   Falls in the past year? 0 0 0 0  Number falls in past yr: 0 0 0   Injury with Fall? 0 0 0   Risk for fall due to : No Fall Risks No Fall Risks No Fall Risks   Follow up Falls prevention discussed Falls prevention discussed;Falls evaluation completed Falls evaluation completed     MEDICARE RISK AT HOME: Medicare Risk at Home Any stairs in or around the home?: No If so, are there any without handrails?: No Home free of loose throw rugs in walkways, pet beds, electrical cords, etc?: Yes Adequate lighting in your home to reduce risk of falls?: Yes Life alert?: No Use of a cane, walker or w/c?: No Grab bars in the bathroom?: Yes Shower chair or bench in shower?: No Elevated toilet seat or a handicapped toilet?: Yes  TIMED UP AND GO:  Was the test performed?  No    Cognitive Function:        02/12/2023    2:54 PM 02/05/2022    9:49 AM 02/04/2021   10:17 AM  6CIT Screen  What Year? 0 points 0 points 0 points  What month?  0 points 0 points 0 points  What time? 0 points 0 points 0 points  Count back from 20 0 points 0 points 0 points  Months in reverse 0 points 0 points 0 points  Repeat phrase 0 points 0 points 2 points  Total Score 0 points 0 points 2 points    Immunizations Immunization History  Administered Date(s) Administered   Fluad Quad(high Dose 65+) 02/10/2021   Influenza Inj Mdck Quad Pf 03/06/2018   Influenza Split 03/07/2011, 03/04/2012   Influenza Whole 07/19/2009   Influenza,inj,Quad PF,6+ Mos 03/04/2013, 01/19/2014, 02/16/2015, 03/07/2016, 02/13/2019   Influenza-Unspecified  02/24/2017, 01/26/2020, 01/17/2022   PFIZER(Purple Top)SARS-COV-2 Vaccination 07/04/2019, 07/29/2019, 03/11/2020, 12/27/2020   PPD Test 02/16/2015   Pneumococcal Conjugate-13 02/13/2019   Pneumococcal Polysaccharide-23 02/10/2021   Td 05/21/2006   Tdap 06/18/2017   Zoster Recombinant(Shingrix) 02/11/2020, 05/26/2020   Zoster, Live 01/19/2014    TDAP status: Up to date  Flu Vaccine status: Due, Education has been provided regarding the importance of this vaccine. Advised may receive this vaccine at local pharmacy or Health Dept. Aware to provide a copy of the vaccination record if obtained from local pharmacy or Health Dept. Verbalized acceptance and understanding.  Pneumococcal vaccine status: Up to date  Covid-19 vaccine status: Declined, Education has been provided regarding the importance of this vaccine but patient still declined. Advised may receive this vaccine at local pharmacy or Health Dept.or vaccine clinic. Aware to provide a copy of the vaccination record if obtained from local pharmacy or Health Dept. Verbalized acceptance and understanding.  Qualifies for Shingles Vaccine? Yes   Zostavax completed Yes   Shingrix Completed?: Yes  Screening Tests Health Maintenance  Topic Date Due   DEXA SCAN  06/28/2022   INFLUENZA VACCINE  12/20/2022   COVID-19 Vaccine (5 - 2023-24 season) 01/20/2023   Medicare Annual Wellness (AWV)  02/12/2024   MAMMOGRAM  10/21/2024   Colonoscopy  08/24/2025   DTaP/Tdap/Td (3 - Td or Tdap) 06/19/2027   Pneumonia Vaccine 38+ Years old  Completed   Hepatitis C Screening  Completed   Zoster Vaccines- Shingrix  Completed   HPV VACCINES  Aged Out    Health Maintenance  Health Maintenance Due  Topic Date Due   DEXA SCAN  06/28/2022   INFLUENZA VACCINE  12/20/2022   COVID-19 Vaccine (5 - 2023-24 season) 01/20/2023    Colorectal cancer screening: Type of screening: Colonoscopy. Completed 08/25/15. Repeat every 10 years  Mammogram status:  Completed 10/22/22. Repeat every year  Bone Density status: Ordered 02/12/23. Pt provided with contact info and advised to call to schedule appt.     Additional Screening:  Hepatitis C Screening: does qualify; Completed 04/16/16  Vision Screening: Recommended annual ophthalmology exams for early detection of glaucoma and other disorders of the eye. Is the patient up to date with their annual eye exam?  Yes  Who is the provider or what is the name of the office in which the patient attends annual eye exams? [facility] If pt is not established with a provider, would they like to be referred to a provider to establish care? No .   Dental Screening: Recommended annual dental exams for proper oral hygiene   Community Resource Referral / Chronic Care Management:  CRR required this visit?  No   CCM required this visit?  No     Plan:     I have personally reviewed and noted the following in the patient's chart:   Medical and social history Use  of alcohol, tobacco or illicit drugs  Current medications and supplements including opioid prescriptions. Patient is not currently taking opioid prescriptions. Functional ability and status Nutritional status Physical activity Advanced directives List of other physicians Hospitalizations, surgeries, and ER visits in previous 12 months Vitals Screenings to include cognitive, depression, and falls Referrals and appointments  In addition, I have reviewed and discussed with patient certain preventive protocols, quality metrics, and best practice recommendations. A written personalized care plan for preventive services as well as general preventive health recommendations were provided to patient.     Tillie Rung, LPN   1/61/0960   After Visit Summary: (MyChart) Due to this being a telephonic visit, the after visit summary with patients personalized plan was offered to patient via MyChart   Nurse Notes: None

## 2023-02-19 ENCOUNTER — Telehealth: Payer: Medicare PPO | Admitting: Physician Assistant

## 2023-02-19 DIAGNOSIS — R3989 Other symptoms and signs involving the genitourinary system: Secondary | ICD-10-CM | POA: Diagnosis not present

## 2023-02-19 MED ORDER — CEPHALEXIN 500 MG PO CAPS: 500.0000 mg | ORAL_CAPSULE | Freq: Two times a day (BID) | ORAL | 0 refills | Status: DC

## 2023-02-19 NOTE — Progress Notes (Signed)

## 2023-06-01 ENCOUNTER — Telehealth: Payer: Medicare PPO | Admitting: Physician Assistant

## 2023-06-01 DIAGNOSIS — R197 Diarrhea, unspecified: Secondary | ICD-10-CM

## 2023-06-01 NOTE — Progress Notes (Signed)
 E-Visit for Diarrhea  We are sorry that you are not feeling well.  Here is how we plan to help!  Based on what you have shared with me it looks like you have Acute Infectious Diarrhea.  Most cases of acute diarrhea are due to infections with virus and bacteria and are self-limited conditions lasting less than 14 days.  For your symptoms you may take Imodium 2 mg tablets that are over the counter at your local pharmacy. Take two tablet now and then one after each loose stool up to 6 a day.  Antibiotics are not needed for most people with diarrhea.  I recommend in person evaluation at Urgent Care or with your Primary Care Physician to test for Cdiff.  HOME CARE We recommend changing your diet to help with your symptoms for the next few days. Drink plenty of fluids that contain water salt and sugar. Sports drinks such as Gatorade may help.  You may try broths, soups, bananas, applesauce, soft breads, mashed potatoes or crackers.  You are considered infectious for as long as the diarrhea continues. Hand washing or use of alcohol based hand sanitizers is recommend. It is best to stay out of work or school until your symptoms stop.   GET HELP RIGHT AWAY If you have dark yellow colored urine or do not pass urine frequently you should drink more fluids.   If your symptoms worsen  If you feel like you are going to pass out (faint) You have a new problem  MAKE SURE YOU  Understand these instructions. Will watch your condition. Will get help right away if you are not doing well or get worse.  Thank you for choosing an e-visit.  Your e-visit answers were reviewed by a board certified advanced clinical practitioner to complete your personal care plan. Depending upon the condition, your plan could have included both over the counter or prescription medications.  Please review your pharmacy choice. Make sure the pharmacy is open so you can pick up prescription now. If there is a problem, you may  contact your provider through [employer] and have the prescription routed to another pharmacy.  Your safety is important to us . If you have drug allergies check your prescription carefully.   For the next 24 hours you can use MyChart to ask questions about today's visit, request a non-urgent call back, or ask for a work or school excuse. You will get an email in the next two days asking about your experience. I hope that your e-visit has been valuable and will speed your recovery.   I have spent 5 minutes in review of e-visit questionnaire, review and updating patient chart, medical decision making and response to patient.   Harlene PEDLAR Ward, PA-C

## 2023-07-15 ENCOUNTER — Telehealth: Payer: Medicare PPO | Admitting: Physician Assistant

## 2023-07-15 DIAGNOSIS — R3989 Other symptoms and signs involving the genitourinary system: Secondary | ICD-10-CM | POA: Diagnosis not present

## 2023-07-15 MED ORDER — CEPHALEXIN 500 MG PO CAPS: 500.0000 mg | ORAL_CAPSULE | Freq: Two times a day (BID) | ORAL | 0 refills | Status: DC

## 2023-07-15 NOTE — Progress Notes (Signed)

## 2023-07-30 ENCOUNTER — Telehealth: Admitting: Physician Assistant

## 2023-07-30 DIAGNOSIS — B9689 Other specified bacterial agents as the cause of diseases classified elsewhere: Secondary | ICD-10-CM | POA: Diagnosis not present

## 2023-07-30 DIAGNOSIS — J019 Acute sinusitis, unspecified: Secondary | ICD-10-CM

## 2023-07-30 MED ORDER — DOXYCYCLINE HYCLATE 100 MG PO TABS: 100.0000 mg | ORAL_TABLET | Freq: Two times a day (BID) | ORAL | 0 refills | Status: DC

## 2023-07-30 NOTE — Progress Notes (Signed)

## 2023-07-30 NOTE — Progress Notes (Signed)
 I have spent 5 minutes in review of e-visit questionnaire, review and updating patient chart, medical decision making and response to patient.   Piedad Climes, PA-C

## 2023-08-29 ENCOUNTER — Ambulatory Visit
Admission: RE | Admit: 2023-08-29 | Discharge: 2023-08-29 | Disposition: A | Discharge status: Home / Self Care | Payer: Medicare PPO | Source: Ambulatory Visit | Attending: Family Medicine | Admitting: Family Medicine

## 2023-08-29 DIAGNOSIS — N958 Other specified menopausal and perimenopausal disorders: Secondary | ICD-10-CM | POA: Diagnosis not present

## 2023-08-29 DIAGNOSIS — Z13820 Encounter for screening for osteoporosis: Secondary | ICD-10-CM

## 2023-08-29 DIAGNOSIS — Z780 Asymptomatic menopausal state: Secondary | ICD-10-CM | POA: Diagnosis not present

## 2023-08-30 ENCOUNTER — Encounter: Payer: Self-pay | Admitting: Family Medicine

## 2023-11-13 ENCOUNTER — Encounter

## 2023-12-16 DIAGNOSIS — Z1231 Encounter for screening mammogram for malignant neoplasm of breast: Secondary | ICD-10-CM | POA: Diagnosis not present

## 2023-12-16 LAB — HM MAMMOGRAPHY

## 2023-12-17 ENCOUNTER — Encounter: Payer: Self-pay | Admitting: Family Medicine

## 2023-12-17 ENCOUNTER — Ambulatory Visit: Payer: Self-pay | Admitting: Family Medicine

## 2023-12-18 ENCOUNTER — Telehealth: Admitting: Family Medicine

## 2023-12-18 DIAGNOSIS — N3000 Acute cystitis without hematuria: Secondary | ICD-10-CM | POA: Diagnosis not present

## 2023-12-18 DIAGNOSIS — R3989 Other symptoms and signs involving the genitourinary system: Secondary | ICD-10-CM | POA: Diagnosis not present

## 2023-12-18 MED ORDER — CEPHALEXIN 500 MG PO CAPS: 500.0000 mg | ORAL_CAPSULE | Freq: Two times a day (BID) | ORAL | 0 refills | Status: AC

## 2023-12-18 NOTE — Progress Notes (Signed)

## 2024-01-14 ENCOUNTER — Encounter: Payer: Self-pay | Admitting: Family Medicine

## 2024-01-14 ENCOUNTER — Ambulatory Visit: Admitting: Family Medicine

## 2024-01-14 VITALS — BP 124/74 | HR 74 | Temp 98.2°F | Ht 63.0 in | Wt 191.4 lb

## 2024-01-14 DIAGNOSIS — L729 Follicular cyst of the skin and subcutaneous tissue, unspecified: Secondary | ICD-10-CM | POA: Insufficient documentation

## 2024-01-14 DIAGNOSIS — L989 Disorder of the skin and subcutaneous tissue, unspecified: Secondary | ICD-10-CM | POA: Insufficient documentation

## 2024-01-14 NOTE — Progress Notes (Signed)
 Patient ID: Elizabeth Lowery, female    DOB: 21-Aug-1955, 68 y.o.   MRN: [id number]  This visit was conducted in person.  BP 124/74   Pulse 74   Temp 98.2 F (36.8 C) (Temporal)   Ht 5' 3 (1.6 m)   Wt 191 lb 6 oz (86.8 kg)   SpO2 97%   BMI 33.90 kg/m    CC:  Chief Complaint  Patient presents with   Knot on Right Index Finger   Cyst    On Back    Subjective:   HPI: Elizabeth Lowery is a 68 y.o. female presenting on 01/14/2024 for Knot on Right Index Finger and Cyst (On Back) New onset knot on her right index finger... noted 4 days ago.  Started as bruising on finger, later saw a pea size lump.  No pain, slight soreness with pressure.  No redness, hot.  No decreased in range of motion   No known injury.  No stiffness.   Has not used meds or ice etc.   Has some OA in  joints.   She also has skin lesion on back  present for years, no change, no redness.     Relevant past medical, surgical, family and social history reviewed and updated as indicated. Interim medical history since our last visit reviewed. Allergies and medications reviewed and updated. Outpatient Medications Prior to Visit  Medication Sig Dispense Refill   acetaminophen (TYLENOL) 500 MG tablet Take 500 mg by mouth every 6 (six) hours as needed.     BIOTIN PO Take 1 tablet by mouth daily.     Calcium Carbonate (CALCIUM 500 PO) Take 3 tablets by mouth daily.     Cholecalciferol (VITAMIN D-3 PO) Take 1 tablet by mouth daily.     fluticasone  (FLONASE ) 50 MCG/ACT nasal spray Place 2 sprays into both nostrils daily. 16 g 0   levocetirizine (XYZAL) 5 MG tablet Take 5 mg by mouth every evening.     Multiple Vitamin (MULTIVITAMIN) tablet Take 1 tablet by mouth daily.      Omega-3 Fatty Acids (FISH OIL) 1000 MG CAPS Take 1 capsule by mouth daily.     vitamin B-12 (CYANOCOBALAMIN) 1000 MCG tablet Take 1,000 mcg by mouth daily.     BIOTIN PO Take 1,000 mcg by mouth daily.     cephALEXin  (KEFLEX ) 500 MG capsule Take  1 capsule (500 mg total) by mouth 2 (two) times daily. 14 capsule 0   doxycycline  (VIBRA -TABS) 100 MG tablet Take 1 tablet (100 mg total) by mouth 2 (two) times daily. 20 tablet 0   No facility-administered medications prior to visit.     Per HPI unless specifically indicated in ROS section below Review of Systems  Constitutional:  Negative for fatigue and fever.  HENT:  Negative for congestion.   Eyes:  Negative for pain.  Respiratory:  Negative for cough and shortness of breath.   Cardiovascular:  Negative for chest pain, palpitations and leg swelling.  Gastrointestinal:  Negative for abdominal pain.  Genitourinary:  Negative for dysuria and vaginal bleeding.  Musculoskeletal:  Negative for back pain.  Neurological:  Negative for syncope, light-headedness and headaches.  Psychiatric/Behavioral:  Negative for dysphoric mood.    Objective:  BP 124/74   Pulse 74   Temp 98.2 F (36.8 C) (Temporal)   Ht 5' 3 (1.6 m)   Wt 191 lb 6 oz (86.8 kg)   SpO2 97%   BMI 33.90 kg/m   Wt Readings  from Last 3 Encounters:  01/14/24 191 lb 6 oz (86.8 kg)  02/12/23 185 lb (83.9 kg)  02/05/22 181 lb (82.1 kg)      Physical Exam Constitutional:      General: She is not in acute distress.    Appearance: Normal appearance. She is well-developed. She is not ill-appearing or toxic-appearing.  HENT:     Head: Normocephalic.     Right Ear: Hearing, tympanic membrane, ear canal and external ear normal. Tympanic membrane is not erythematous, retracted or bulging.     Left Ear: Hearing, tympanic membrane, ear canal and external ear normal. Tympanic membrane is not erythematous, retracted or bulging.     Nose: No mucosal edema or rhinorrhea.     Right Sinus: No maxillary sinus tenderness or frontal sinus tenderness.     Left Sinus: No maxillary sinus tenderness or frontal sinus tenderness.     Mouth/Throat:     Pharynx: Uvula midline.  Eyes:     General: Lids are normal. Lids are everted, no  foreign bodies appreciated.     Conjunctiva/sclera: Conjunctivae normal.     Pupils: Pupils are equal, round, and reactive to light.  Neck:     Thyroid: No thyroid mass or thyromegaly.     Vascular: No carotid bruit.     Trachea: Trachea normal.  Cardiovascular:     Rate and Rhythm: Normal rate and regular rhythm.     Pulses: Normal pulses.     Heart sounds: Normal heart sounds, S1 normal and S2 normal. No murmur heard.    No friction rub. No gallop.  Pulmonary:     Effort: Pulmonary effort is normal. No tachypnea or respiratory distress.     Breath sounds: Normal breath sounds. No decreased breath sounds, wheezing, rhonchi or rales.  Abdominal:     General: Bowel sounds are normal.     Palpations: Abdomen is soft.     Tenderness: There is no abdominal tenderness.  Musculoskeletal:     Right hand: Normal. No swelling, deformity, lacerations, tenderness or bony tenderness. Normal range of motion. Normal strength. Normal sensation. Normal capillary refill.     Left hand: Normal.     Cervical back: Normal range of motion and neck supple.     Comments: Below right index finger PIP joint.. small mobile pea size swelling, no redness  Skin:    General: Skin is warm and dry.     Findings: Lesion present. No rash.         Comments: See photo  Neurological:     Mental Status: She is alert.  Psychiatric:        Mood and Affect: Mood is not anxious or depressed.        Speech: Speech normal.        Behavior: Behavior normal. Behavior is cooperative.        Thought Content: Thought content normal.        Judgment: Judgment normal.       Results for orders placed or performed in visit on 12/17/23  [facility] MAMMOGRAPHY   Collection Time: 12/16/23  7:34 AM  Result Value Ref Range   [facility] Mammogram 0-4 Bi-Rad 0-4 Bi-Rad, Self Reported Normal    Assessment and Plan  Lesion of finger Assessment & Plan: Acute, area on index finger possibly secondary to unknown injury and soft tissue swelling  versus joint associated cyst and less likely ganglion cyst. No sign of infection. Recommended following over time as area has decreased in  swelling in the last 24 hours.  No further bruising. No joint pain and seems to be below joint as opposed to at joint line.  Return and ER precautions provided.   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  No red flags. Patient reassured benign skin lesion.  Reviewed what to do if area becomes infected.  Return and ER precautions provided.     No follow-ups on file.   Greig Ring, MD

## 2024-01-14 NOTE — Assessment & Plan Note (Signed)
 Chronic, no associated infection.  No red flags. Patient reassured benign skin lesion.  Reviewed what to do if area becomes infected.  Return and ER precautions provided.

## 2024-01-14 NOTE — Assessment & Plan Note (Signed)
 Acute, area on index finger possibly secondary to unknown injury and soft tissue swelling versus joint associated cyst and less likely ganglion cyst. No sign of infection. Recommended following over time as area has decreased in swelling in the last 24 hours.  No further bruising. No joint pain and seems to be below joint as opposed to at joint line.  Return and ER precautions provided.

## 2024-02-18 ENCOUNTER — Ambulatory Visit (INDEPENDENT_AMBULATORY_CARE_PROVIDER_SITE_OTHER): Payer: Medicare PPO

## 2024-02-18 VITALS — Ht 63.0 in | Wt 181.0 lb

## 2024-02-18 DIAGNOSIS — Z0000 Encounter for general adult medical examination without abnormal findings: Secondary | ICD-10-CM

## 2024-02-18 NOTE — Patient Instructions (Signed)
 Elizabeth Lowery,  Thank you for taking the time for your Medicare Wellness Visit. I appreciate your continued commitment to your health goals. Please review the care plan we discussed, and feel free to reach out if I can assist you further.  Medicare recommends these wellness visits once per year to help you and your care team stay ahead of potential health issues. These visits are designed to focus on prevention, allowing your provider to concentrate on managing your acute and chronic conditions during your regular appointments.  Please note that Annual Wellness Visits do not include a physical exam. Some assessments may be limited, especially if the visit was conducted virtually. If needed, we may recommend a separate in-person follow-up with your provider.  Ongoing Care Seeing your primary care provider every 3 to 6 months helps us  monitor your health and provide consistent, personalized care.   Referrals If a referral was made during today's visit and you haven't received any updates within two weeks, please contact the referred provider directly to check on the status.  Recommended Screenings:  Health Maintenance  Topic Date Due   COVID-19 Vaccine (5 - 2025-26 season) 01/20/2024   Medicare Annual Wellness Visit  02/12/2024   Flu Shot  08/18/2024*   Colon Cancer Screening  08/24/2025   Breast Cancer Screening  12/15/2025   DTaP/Tdap/Td vaccine (3 - Td or Tdap) 06/19/2027   DEXA scan (bone density measurement)  08/28/2028   Pneumococcal Vaccine for age over 38  Completed   Hepatitis C Screening  Completed   Zoster (Shingles) Vaccine  Completed   HPV Vaccine  Aged Out   Meningitis B Vaccine  Aged Out  *Topic was postponed. The date shown is not the original due date.       02/18/2024    2:32 PM  Advanced Directives  Does Patient Have a Medical Advance Directive? No   Advance Care Planning is important because it: Ensures you receive medical care that aligns with your values,  goals, and preferences. Provides guidance to your family and loved ones, reducing the emotional burden of decision-making during critical moments.  Vision: Annual vision screenings are recommended for early detection of glaucoma, cataracts, and diabetic retinopathy. These exams can also reveal signs of chronic conditions such as diabetes and high blood pressure.  Dental: Annual dental screenings help detect early signs of oral cancer, gum disease, and other conditions linked to overall health, including heart disease and diabetes.  Please see the attached documents for additional preventive care recommendations.

## 2024-02-18 NOTE — Progress Notes (Signed)
 Subjective:   Elizabeth Lowery is a 68 y.o. who presents for a Medicare Wellness preventive visit.  As a reminder, Annual Wellness Visits don't include a physical exam, and some assessments may be limited, especially if this visit is performed virtually. We may recommend an in-person follow-up visit with your provider if needed.  Visit Complete: Virtual I connected with  Sari GORMAN [place] on 02/18/24 by a audio enabled telemedicine application and verified that I am speaking with the correct person using two identifiers.  Patient Location: Home  Provider Location: Office/Clinic  I discussed the limitations of evaluation and management by telemedicine. The patient expressed understanding and agreed to proceed.  Vital Signs: Because this visit was a virtual/telehealth visit, some criteria may be missing or patient reported. Any vitals not documented were not able to be obtained and vitals that have been documented are patient reported.  VideoDeclined- This patient declined Librarian, academic. Therefore the visit was completed with audio only.  Persons Participating in Visit: Patient.  AWV Questionnaire: No: Patient Medicare AWV questionnaire was not completed prior to this visit.  Cardiac Risk Factors include: advanced age (>46men, >80 women);dyslipidemia;hypertension;obesity (BMI >30kg/m2)     Objective:    Today's Vitals   02/18/24 1423  Weight: 181 lb (82.1 kg)  Height: 5' 3 (1.6 m)   Body mass index is 32.06 kg/m.     02/18/2024    2:32 PM 02/12/2023    2:53 PM 02/05/2022    9:47 AM 08/25/2015    7:38 AM 04/18/2011    1:27 PM  Advanced Directives  Does Patient Have a Medical Advance Directive? No No No No  Patient would not like information;Patient does not have advance directive   Would patient like information on creating a medical advance directive?  No - Patient declined No - Patient declined No - patient declined information       Data saved  with a previous flowsheet row definition    Current Medications (verified) Outpatient Encounter Medications as of 02/18/2024  Medication Sig   acetaminophen (TYLENOL) 500 MG tablet Take 500 mg by mouth every 6 (six) hours as needed.   BIOTIN PO Take 1 tablet by mouth daily.   Calcium Carbonate (CALCIUM 500 PO) Take 3 tablets by mouth daily.   Cholecalciferol (VITAMIN D-3 PO) Take 1 tablet by mouth daily.   fluticasone  (FLONASE ) 50 MCG/ACT nasal spray Place 2 sprays into both nostrils daily.   levocetirizine (XYZAL) 5 MG tablet Take 5 mg by mouth every evening.   Multiple Vitamin (MULTIVITAMIN) tablet Take 1 tablet by mouth daily.    Omega-3 Fatty Acids (FISH OIL) 1000 MG CAPS Take 1 capsule by mouth daily.   vitamin B-12 (CYANOCOBALAMIN) 1000 MCG tablet Take 1,000 mcg by mouth daily.   No facility-administered encounter medications on file as of 02/18/2024.    Allergies (verified) Patient has no known allergies.   History: Past Medical History:  Diagnosis Date   Arthritis    osteoarthritis   Hyperlipidemia    Menopause    Morbid obesity (HCC)    PONV (postoperative nausea and vomiting)    RUQ pain 2012   ruq pain with fatty lover of u/s, no gallstones and normal HIDA scan   Seasonal allergies    Sinus problem    Past Surgical History:  Procedure Laterality Date   JOINT REPLACEMENT  2010, 2011   right on 12/2008, left on 07/2009   LAPAROSCOPIC GASTRIC BANDING  04/24/2011  Procedure: LAPAROSCOPIC GASTRIC BANDING;  Surgeon: Alm VEAR Angle, MD;  Location: [facility];  Service: General;  Laterality: N/A;  with Mesh under port   TOTAL HIP ARTHROPLASTY  2010, 2012   Bilateral   TUBAL LIGATION     Family History  Problem Relation Age of Onset   Lymphoma Father    Lung cancer Father    Cancer Father 25       lymphoma, lung   Osteoarthritis Father    Hyperlipidemia Mother    Osteoporosis Mother    Cancer Mother    Other Mother        spinal stenosis   Diabetes Brother     Other Brother        morbid obesity, lung issues   Osteoarthritis Brother    Sleep apnea Brother    Osteoarthritis Sister    Hyperlipidemia Sister    Hypertension Sister    Other Sister        morbid obesity   Colon cancer Maternal Grandmother    Cancer Maternal Grandmother        colon   Coronary artery disease Paternal Grandmother    Social History   Socioeconomic History   Marital status: Divorced    Spouse name: Not on file   Number of children: 1   Years of education: Not on file   Highest education level: Not on file  Occupational History   Occupation: Retired from Chief [employer]: RETIRED   Occupation: rental properties    Comment: part time  Tobacco Use   Smoking status: Never   Smokeless tobacco: Never  Substance and Sexual Activity   Alcohol use: Yes    Alcohol/week: 1.0 standard drink of alcohol    Types: 1 Glasses of wine per week    Comment: 1-2 wine on weekends   Drug use: No   Sexual activity: Yes  Other Topics Concern   Not on file  Social History Narrative   Regular exercise: yes, but more limited due to heel painDiet:ruits and veggies.   Social Drivers of Corporate investment banker Strain: Low Risk  (02/18/2024)   Overall Financial Resource Strain (CARDIA)    Difficulty of Paying Living Expenses: Not hard at all  Food Insecurity: No Food Insecurity (02/18/2024)   Hunger Vital Sign    Worried About Running Out of Food in the Last Year: Never true    Ran Out of Food in the Last Year: Never true  Transportation Needs: No Transportation Needs (02/18/2024)   PRAPARE - Administrator, Civil Service (Medical): No    Lack of Transportation (Non-Medical): No  Physical Activity: Sufficiently Active (02/18/2024)   Exercise Vital Sign    Days of Exercise per Week: 4 days    Minutes of Exercise per Session: 60 min  Stress: No Stress Concern Present (02/18/2024)   [employer] of Occupational Health - Occupational Stress Questionnaire     Feeling of Stress: Not at all  Social Connections: Moderately Integrated (02/18/2024)   Social Connection and Isolation Panel    Frequency of Communication with Friends and Family: More than three times a week    Frequency of Social Gatherings with Friends and Family: More than three times a week    Attends Religious Services: More than 4 times per year    Active Member of [employer] or Organizations: Yes    Attends Banker Meetings: More than 4 times per year    Marital Status:  Divorced    Tobacco Counseling Counseling given: Not Answered    Clinical Intake:  Pre-visit preparation completed: Yes  Pain : No/denies pain     BMI - recorded: 32.06 Nutritional Status: BMI > 30  Obese Nutritional Risks: None Diabetes: No  No results found for: HGBA1C   How often do you need to have someone help you when you read instructions, pamphlets, or other written materials from your doctor or pharmacy?: 1 - Never  Interpreter Needed?: No  Comments: lives alone Information entered by :: B.Parul Porcelli,LPN   Activities of Daily Living     02/18/2024    2:33 PM  In your present state of health, do you have any difficulty performing the following activities:  Hearing? 0  Vision? 0  Difficulty concentrating or making decisions? 0  Walking or climbing stairs? 0  Dressing or bathing? 0  Doing errands, shopping? 0  Preparing Food and eating ? N  Using the Toilet? N  In the past six months, have you accidently leaked urine? N  Do you have problems with loss of bowel control? N  Managing your Medications? N  Managing your Finances? N  Housekeeping or managing your Housekeeping? N    Patient Care Team: Avelina Greig BRAVO, MD as PCP - General Ernie Cough, MD as Consulting Physician (Orthopedic Surgery) Kallie Greig SAUNDERS, RD (Inactive) as Diabetes Educator Lyn Loving, PhD as Consulting Physician (Psychology) Roni, [facility]  I have updated your Care Teams any recent  Medical Services you may have received from other providers in the past year.     Assessment:   This is a routine wellness examination for Elizabeth Lowery.  Hearing/Vision screen Hearing Screening - Comments:: Patient denies any hearing difficulties.   Vision Screening - Comments:: Pt says their vision is good without glasses;readers only Dr  Mevelyn   Goals Addressed               This Visit's Progress     COMPLETED: DIET - EAT MORE FRUITS AND VEGETABLES        Lose weight. (pt-stated)   On track     02/18/24-Stay Healthy!       Depression Screen     02/18/2024    2:29 PM 02/12/2023    2:51 PM 02/05/2022    9:46 AM 02/04/2021   10:17 AM 02/04/2021   10:15 AM 02/13/2019    3:17 PM 06/18/2017    7:43 AM  PHQ 2/9 Scores  PHQ - 2 Score 0 0 0 0 0 0 0  PHQ- 9 Score   0        Fall Risk     02/18/2024    2:25 PM 01/14/2024    2:32 PM 02/12/2023    2:53 PM 02/05/2022    9:48 AM 02/04/2021   10:17 AM  Fall Risk   Falls in the past year? 0 0 0 0 0  Number falls in past yr: 0 0 0 0 0  Injury with Fall? 0 0 0 0 0  Risk for fall due to : No Fall Risks No Fall Risks No Fall Risks No Fall Risks No Fall Risks  Follow up Falls prevention discussed;Education provided Falls evaluation completed Falls prevention discussed Falls prevention discussed;Falls evaluation completed  Falls evaluation completed      Data saved with a previous flowsheet row definition    MEDICARE RISK AT HOME:  Medicare Risk at Home Any stairs in or around the home?: Yes If so,  are there any without handrails?: Yes Home free of loose throw rugs in walkways, pet beds, electrical cords, etc?: Yes Adequate lighting in your home to reduce risk of falls?: Yes Life alert?: No Use of a cane, walker or w/c?: No Grab bars in the bathroom?: Yes Shower chair or bench in shower?: No Elevated toilet seat or a handicapped toilet?: Yes  TIMED UP AND GO:  Was the test performed?  No  Cognitive Function: 6CIT completed         02/18/2024    2:35 PM 02/12/2023    2:54 PM 02/05/2022    9:49 AM 02/04/2021   10:17 AM  6CIT Screen  What Year? 0 points 0 points 0 points 0 points  What month? 0 points 0 points 0 points 0 points  What time? 0 points 0 points 0 points 0 points  Count back from 20 0 points 0 points 0 points 0 points  Months in reverse 0 points 0 points 0 points 0 points  Repeat phrase 0 points 0 points 0 points 2 points  Total Score 0 points 0 points 0 points 2 points    Immunizations Immunization History  Administered Date(s) Administered   Fluad Quad(high Dose 65+) 02/10/2021   Influenza Inj Mdck Quad Pf 03/06/2018   Influenza Split 03/07/2011, 03/04/2012   Influenza Whole 07/19/2009   Influenza,inj,Quad PF,6+ Mos 03/04/2013, 01/19/2014, 02/16/2015, 03/07/2016, 02/13/2019   Influenza-Unspecified 02/24/2017, 01/26/2020, 01/17/2022, 02/08/2023   PFIZER(Purple Top)SARS-COV-2 Vaccination 07/04/2019, 07/29/2019, 03/11/2020, 12/27/2020   PPD Test 02/16/2015   Pneumococcal Conjugate-13 02/13/2019   Pneumococcal Polysaccharide-23 02/10/2021   Td 05/21/2006   Tdap 06/18/2017   Zoster Recombinant(Shingrix) 02/11/2020, 05/26/2020   Zoster, Live 01/19/2014    Screening Tests Health Maintenance  Topic Date Due   COVID-19 Vaccine (5 - 2025-26 season) 01/20/2024   Influenza Vaccine  08/18/2024 (Originally 12/20/2023)   Medicare Annual Wellness (AWV)  02/17/2025   Colonoscopy  08/24/2025   Mammogram  12/15/2025   DTaP/Tdap/Td (3 - Td or Tdap) 06/19/2027   DEXA SCAN  08/28/2028   Pneumococcal Vaccine: 50+ Years  Completed   Hepatitis C Screening  Completed   Zoster Vaccines- Shingrix  Completed   HPV VACCINES  Aged Out   Meningococcal B Vaccine  Aged Out    Health Maintenance Items Addressed: None due at this time. Pt will receive Covid vaccine  at their pharmcy when decided to obtain   Additional Screening:  Vision Screening: Recommended annual ophthalmology exams for early detection of  glaucoma and other disorders of the eye. Is the patient up to date with their annual eye exam?  Yes  Who is the provider or what is the name of the office in which the patient attends annual eye exams? Dr Mevelyn  Dental Screening: Recommended annual dental exams for proper oral hygiene  Community Resource Referral / Chronic Care Management: CRR required this visit?  No   CCM required this visit?  No   Plan:    I have personally reviewed and noted the following in the patient's chart:   Medical and social history Use of alcohol, tobacco or illicit drugs  Current medications and supplements including opioid prescriptions. Patient is not currently taking opioid prescriptions. Functional ability and status Nutritional status Physical activity Advanced directives List of other physicians Hospitalizations, surgeries, and ER visits in previous 12 months Vitals Screenings to include cognitive, depression, and falls Referrals and appointments  In addition, I have reviewed and discussed with patient certain preventive protocols, quality metrics,  and best practice recommendations. A written personalized care plan for preventive services as well as general preventive health recommendations were provided to patient.   Erminio LITTIE Saris, LPN   [id number]   After Visit Summary: (MyChart) Due to this being a telephonic visit, the after visit summary with patients personalized plan was offered to patient via MyChart   Notes: Nothing significant to report at this time.

## 2024-03-31 ENCOUNTER — Telehealth: Payer: Self-pay | Admitting: *Deleted

## 2024-03-31 DIAGNOSIS — E782 Mixed hyperlipidemia: Secondary | ICD-10-CM

## 2024-03-31 DIAGNOSIS — Z0000 Encounter for general adult medical examination without abnormal findings: Secondary | ICD-10-CM

## 2024-03-31 DIAGNOSIS — E6609 Other obesity due to excess calories: Secondary | ICD-10-CM

## 2024-03-31 DIAGNOSIS — Z9884 Bariatric surgery status: Secondary | ICD-10-CM

## 2024-03-31 NOTE — Telephone Encounter (Signed)
-----   Message from Veva JINNY Ferrari sent at 03/31/2024 12:00 PM EST ----- Regarding: Lab orders for Tue, 11.25.25 Patient is scheduled for CPX labs, please order future labs, Thanks , Veva

## 2024-04-14 ENCOUNTER — Ambulatory Visit: Payer: Self-pay | Admitting: Family Medicine

## 2024-04-14 ENCOUNTER — Other Ambulatory Visit (INDEPENDENT_AMBULATORY_CARE_PROVIDER_SITE_OTHER)

## 2024-04-14 DIAGNOSIS — E782 Mixed hyperlipidemia: Secondary | ICD-10-CM

## 2024-04-14 DIAGNOSIS — E66811 Obesity, class 1: Secondary | ICD-10-CM

## 2024-04-14 DIAGNOSIS — Z6831 Body mass index (BMI) 31.0-31.9, adult: Secondary | ICD-10-CM

## 2024-04-14 DIAGNOSIS — Z9884 Bariatric surgery status: Secondary | ICD-10-CM

## 2024-04-14 DIAGNOSIS — E6609 Other obesity due to excess calories: Secondary | ICD-10-CM | POA: Diagnosis not present

## 2024-04-14 LAB — CBC WITH DIFFERENTIAL/PLATELET
Basophils Absolute: 0 K/uL (ref 0.0–0.1)
Basophils Relative: 0.6 % (ref 0.0–3.0)
Eosinophils Absolute: 0.1 K/uL (ref 0.0–0.7)
Eosinophils Relative: 2.9 % (ref 0.0–5.0)
HCT: 43.3 % (ref 36.0–46.0)
Hemoglobin: 14.6 g/dL (ref 12.0–15.0)
Lymphocytes Relative: 33.9 % (ref 12.0–46.0)
Lymphs Abs: 1.4 K/uL (ref 0.7–4.0)
MCHC: 33.7 g/dL (ref 30.0–36.0)
MCV: 91.4 fl (ref 78.0–100.0)
Monocytes Absolute: 0.3 K/uL (ref 0.1–1.0)
Monocytes Relative: 8.1 % (ref 3.0–12.0)
Neutro Abs: 2.2 K/uL (ref 1.4–7.7)
Neutrophils Relative %: 54.5 % (ref 43.0–77.0)
Platelets: 204 K/uL (ref 150.0–400.0)
RBC: 4.74 Mil/uL (ref 3.87–5.11)
RDW: 13.9 % (ref 11.5–15.5)
WBC: 4.1 K/uL (ref 4.0–10.5)

## 2024-04-14 LAB — VITAMIN B12: Vitamin B-12: 776 pg/mL (ref 211–911)

## 2024-04-14 LAB — COMPREHENSIVE METABOLIC PANEL WITH GFR
ALT: 16 U/L (ref 0–35)
AST: 18 U/L (ref 0–37)
Albumin: 4.1 g/dL (ref 3.5–5.2)
Alkaline Phosphatase: 47 U/L (ref 39–117)
BUN: 23 mg/dL (ref 6–23)
CO2: 29 meq/L (ref 19–32)
Calcium: 9.3 mg/dL (ref 8.4–10.5)
Chloride: 107 meq/L (ref 96–112)
Creatinine, Ser: 0.87 mg/dL (ref 0.40–1.20)
GFR: 67.54 mL/min (ref >60.00)
Glucose, Bld: 84 mg/dL (ref 70–99)
Potassium: 4.2 meq/L (ref 3.5–5.1)
Sodium: 141 meq/L (ref 135–145)
Total Bilirubin: 0.6 mg/dL (ref 0.2–1.2)
Total Protein: 6.3 g/dL (ref 6.0–8.3)

## 2024-04-14 LAB — LIPID PANEL
Cholesterol: 208 mg/dL — ABNORMAL HIGH (ref 0–200)
HDL: 53.5 mg/dL (ref >39.00)
LDL Cholesterol: 137 mg/dL — ABNORMAL HIGH (ref 0–99)
NonHDL: 154.72
Total CHOL/HDL Ratio: 4
Triglycerides: 88 mg/dL (ref 0.0–149.0)
VLDL: 17.6 mg/dL (ref 0.0–40.0)

## 2024-04-14 LAB — VITAMIN D 25 HYDROXY (VIT D DEFICIENCY, FRACTURES): VITD: 57.52 ng/mL (ref 30.00–100.00)

## 2024-04-14 NOTE — Progress Notes (Signed)
 No critical labs need to be addressed urgently. We will discuss labs in detail at upcoming office visit.

## 2024-04-19 ENCOUNTER — Telehealth: Admitting: Physician Assistant

## 2024-04-19 DIAGNOSIS — R3989 Other symptoms and signs involving the genitourinary system: Secondary | ICD-10-CM | POA: Diagnosis not present

## 2024-04-19 DIAGNOSIS — R300 Dysuria: Secondary | ICD-10-CM

## 2024-04-19 MED ORDER — CEPHALEXIN 500 MG PO CAPS: 500.0000 mg | ORAL_CAPSULE | Freq: Two times a day (BID) | ORAL | 0 refills | Status: AC

## 2024-04-19 NOTE — Progress Notes (Signed)

## 2024-04-21 ENCOUNTER — Encounter: Payer: Self-pay | Admitting: Family Medicine

## 2024-04-21 ENCOUNTER — Ambulatory Visit: Admitting: Family Medicine

## 2024-04-21 VITALS — BP 122/70 | HR 83 | Temp 97.7°F | Ht 63.0 in | Wt 190.1 lb

## 2024-04-21 DIAGNOSIS — E782 Mixed hyperlipidemia: Secondary | ICD-10-CM

## 2024-04-21 DIAGNOSIS — I10 Essential (primary) hypertension: Secondary | ICD-10-CM | POA: Diagnosis not present

## 2024-04-21 DIAGNOSIS — Z0000 Encounter for general adult medical examination without abnormal findings: Secondary | ICD-10-CM | POA: Diagnosis not present

## 2024-04-21 NOTE — Assessment & Plan Note (Signed)
Essentially resolved with weight loss! 

## 2024-04-21 NOTE — Assessment & Plan Note (Addendum)
 Inadequate control.. 8.8% 10 year CVD risk. She will work on low cholesterol diet. Encouraged exercise, weight loss, healthy eating habits.

## 2024-04-21 NOTE — Progress Notes (Signed)
 Patient ID: Elizabeth Lowery, female    DOB: 01/16/1956, 68 y.o.   MRN: [id number]  This visit was conducted in person.  BP 122/70   Pulse 83   Temp 97.7 F (36.5 C) (Temporal)   Ht 5' 3 (1.6 m)   Wt 190 lb 2 oz (86.2 kg)   SpO2 97%   BMI 33.68 kg/m    CC: Chief Complaint  Patient presents with   Annual Exam    Part 2 MWV 02/18/24    Subjective:   HPI: Elizabeth Lowery is a 68 y.o. female presenting on 04/21/2024 for Annual Exam (Part 2/MWV 02/18/24)   The patient presents for complete physical and review of chronic health problems. He/She also has the following acute concerns today: none  The patient saw a LPN or RN for medicare wellness visit. 01/2024  Prevention and wellness was reviewed in detail. Note reviewed.  Elevated Cholesterol:   Lab Results  Component Value Date   CHOL 208 (H) 04/14/2024   HDL 53.50 04/14/2024   LDLCALC 137 (H) 04/14/2024   LDLDIRECT 131.4 11/26/2011   TRIG 88.0 04/14/2024   CHOLHDL 4 04/14/2024  The 10-year ASCVD risk score (Arnett DK, et al., 2019) is: 8.8%   Values used to calculate the score:     Age: 72 years     Clincally relevant sex: Female     Is Non-Hispanic African American: No     Diabetic: No     Tobacco smoker: No     Systolic Blood Pressure: 122 mmHg     Is BP treated: No     HDL Cholesterol: 53.5 mg/dL     Total Cholesterol: 208 mg/dL  Using medications without problems: Muscle aches:  Diet compliance: She is eating smaller meals each day, healthy choices. Exercise: walking 4-5 times a week. Other complaints:  Hypertension:   Good  control on no medication. BP Readings from Last 3 Encounters:  04/21/24 122/70  01/14/24 124/74  02/10/21 130/70  Using medication without problems or lightheadedness: none Chest pain with exertion:none Edema:none Short of breath:none Average home BPs: Other issues:     Wt Readings from Last 3 Encounters:  04/21/24 190 lb 2 oz (86.2 kg)  02/18/24 181 lb (82.1 kg)  01/14/24  191 lb 6 oz (86.8 kg)     Relevant past medical, surgical, family and social history reviewed and updated as indicated. Interim medical history since our last visit reviewed. Allergies and medications reviewed and updated. Outpatient Medications Prior to Visit  Medication Sig Dispense Refill   acetaminophen (TYLENOL) 500 MG tablet Take 500 mg by mouth every 6 (six) hours as needed.     BIOTIN PO Take 1 tablet by mouth daily.     Calcium Carbonate (CALCIUM 500 PO) Take 3 tablets by mouth daily.     cephALEXin  (KEFLEX ) 500 MG capsule Take 1 capsule (500 mg total) by mouth 2 (two) times daily for 7 days. 14 capsule 0   Cholecalciferol (VITAMIN D -3 PO) Take 1 tablet by mouth daily.     fluticasone  (FLONASE ) 50 MCG/ACT nasal spray Place 2 sprays into both nostrils daily. 16 g 0   levocetirizine (XYZAL) 5 MG tablet Take 5 mg by mouth every evening.     Multiple Vitamin (MULTIVITAMIN) tablet Take 1 tablet by mouth daily.      Omega-3 Fatty Acids (FISH OIL) 1000 MG CAPS Take 1 capsule by mouth daily.     vitamin B-12 (CYANOCOBALAMIN ) 1000 MCG tablet Take  1,000 mcg by mouth daily.     No facility-administered medications prior to visit.     Per HPI unless specifically indicated in ROS section below Review of Systems  Constitutional:  Negative for fatigue and fever.  HENT:  Negative for congestion.   Eyes:  Negative for pain.  Respiratory:  Negative for cough and shortness of breath.   Cardiovascular:  Negative for chest pain, palpitations and leg swelling.  Gastrointestinal:  Negative for abdominal pain.  Genitourinary:  Negative for dysuria and vaginal bleeding.  Musculoskeletal:  Negative for back pain.  Neurological:  Negative for syncope, light-headedness and headaches.  Psychiatric/Behavioral:  Negative for dysphoric mood.    Objective:  BP 122/70   Pulse 83   Temp 97.7 F (36.5 C) (Temporal)   Ht 5' 3 (1.6 m)   Wt 190 lb 2 oz (86.2 kg)   SpO2 97%   BMI 33.68 kg/m   Wt  Readings from Last 3 Encounters:  04/21/24 190 lb 2 oz (86.2 kg)  02/18/24 181 lb (82.1 kg)  01/14/24 191 lb 6 oz (86.8 kg)      Physical Exam Constitutional:      General: She is not in acute distress.    Appearance: Normal appearance. She is well-developed. She is not ill-appearing or toxic-appearing.  HENT:     Head: Normocephalic.     Right Ear: Hearing, tympanic membrane, ear canal and external ear normal. Tympanic membrane is not erythematous, retracted or bulging.     Left Ear: Hearing, tympanic membrane, ear canal and external ear normal. Tympanic membrane is not erythematous, retracted or bulging.     Nose: No mucosal edema or rhinorrhea.     Right Sinus: No maxillary sinus tenderness or frontal sinus tenderness.     Left Sinus: No maxillary sinus tenderness or frontal sinus tenderness.     Mouth/Throat:     Pharynx: Uvula midline.  Eyes:     General: Lids are normal. Lids are everted, no foreign bodies appreciated.     Conjunctiva/sclera: Conjunctivae normal.     Pupils: Pupils are equal, round, and reactive to light.  Neck:     Thyroid: No thyroid mass or thyromegaly.     Vascular: No carotid bruit.     Trachea: Trachea normal.  Cardiovascular:     Rate and Rhythm: Normal rate and regular rhythm.     Pulses: Normal pulses.     Heart sounds: Normal heart sounds, S1 normal and S2 normal. No murmur heard.    No friction rub. No gallop.  Pulmonary:     Effort: Pulmonary effort is normal. No tachypnea or respiratory distress.     Breath sounds: Normal breath sounds. No decreased breath sounds, wheezing, rhonchi or rales.  Abdominal:     General: Bowel sounds are normal.     Palpations: Abdomen is soft.     Tenderness: There is no abdominal tenderness.  Musculoskeletal:     Cervical back: Normal range of motion and neck supple.  Skin:    General: Skin is warm and dry.     Findings: No rash.  Neurological:     Mental Status: She is alert.  Psychiatric:        Mood  and Affect: Mood is not anxious or depressed.        Speech: Speech normal.        Behavior: Behavior normal. Behavior is cooperative.        Thought Content: Thought content normal.  Judgment: Judgment normal.       Results for orders placed or performed in visit on 04/14/24  CBC with Differential/Platelet   Collection Time: 04/14/24  7:52 AM  Result Value Ref Range   WBC 4.1 4.0 - 10.5 K/uL   RBC 4.74 3.87 - 5.11 Mil/uL   Hemoglobin 14.6 12.0 - 15.0 g/dL   HCT 56.6 63.9 - 53.9 %   MCV 91.4 78.0 - 100.0 fl   MCHC 33.7 30.0 - 36.0 g/dL   RDW 86.0 88.4 - 84.4 %   Platelets 204.0 150.0 - 400.0 K/uL   Neutrophils Relative % 54.5 43.0 - 77.0 %   Lymphocytes Relative 33.9 12.0 - 46.0 %   Monocytes Relative 8.1 3.0 - 12.0 %   Eosinophils Relative 2.9 0.0 - 5.0 %   Basophils Relative 0.6 0.0 - 3.0 %   Neutro Abs 2.2 1.4 - 7.7 K/uL   Lymphs Abs 1.4 0.7 - 4.0 K/uL   Monocytes Absolute 0.3 0.1 - 1.0 K/uL   Eosinophils Absolute 0.1 0.0 - 0.7 K/uL   Basophils Absolute 0.0 0.0 - 0.1 K/uL  Vitamin B12   Collection Time: 04/14/24  7:52 AM  Result Value Ref Range   Vitamin B-12 776 211 - 911 pg/mL  VITAMIN D  25 Hydroxy (Vit-D Deficiency, Fractures)   Collection Time: 04/14/24  7:52 AM  Result Value Ref Range   VITD 57.52 30.00 - 100.00 ng/mL  Comprehensive metabolic panel   Collection Time: 04/14/24  7:52 AM  Result Value Ref Range   Sodium 141 135 - 145 mEq/L   Potassium 4.2 3.5 - 5.1 mEq/L   Chloride 107 96 - 112 mEq/L   CO2 29 19 - 32 mEq/L   Glucose, Bld 84 70 - 99 mg/dL   BUN 23 6 - 23 mg/dL   Creatinine, Ser 9.12 0.40 - 1.20 mg/dL   Total Bilirubin 0.6 0.2 - 1.2 mg/dL   Alkaline Phosphatase 47 39 - 117 U/L   AST 18 0 - 37 U/L   ALT 16 0 - 35 U/L   Total Protein 6.3 6.0 - 8.3 g/dL   Albumin 4.1 3.5 - 5.2 g/dL   GFR 32.45 >39.99 mL/min   Calcium 9.3 8.4 - 10.5 mg/dL  Lipid Panel   Collection Time: 04/14/24  7:52 AM  Result Value Ref Range   Cholesterol 208 (H) 0 -  200 mg/dL   Triglycerides 11.9 0.0 - 149.0 mg/dL   HDL 46.49 >60.99 mg/dL   VLDL 82.3 0.0 - 59.9 mg/dL   LDL Cholesterol 862 (H) 0 - 99 mg/dL   Total CHOL/HDL Ratio 4    NonHDL 154.72     This visit occurred during the SARS-CoV-2 public health emergency.  Safety protocols were in place, including screening questions prior to the visit, additional usage of staff PPE, and extensive cleaning of exam room while observing appropriate contact time as indicated for disinfecting solutions.   COVID 19 screen:  No recent travel or known exposure to COVID19 The patient denies respiratory symptoms of COVID 19 at this time. The importance of social distancing was discussed today.   Assessment and Plan The patient's preventative maintenance and recommended screening tests for an annual wellness exam were reviewed in full today. Brought up to date unless services declined.  Counselled on the importance of diet, exercise, and its role in overall health and mortality. The patient's FH and SH was reviewed, including their home life, tobacco status, and drug and alcohol status.  PAP, every 5 years, Last done 2019 no further indicated. Mammo 11/2023 Colon, [place],  nml repeat 2027   Vaccines:   Uptodate  pNA 23 and high dose flu, s/p COVID x 4,  She had shingrix 2022 per report, SE to FLU in past. DEXA: 08/2023 normal Hep C:  neg HIV: refused.   Problem List Items Addressed This Visit     Hyperlipidemia    Inadequate control.. 8.8% 10 year CVD risk. She will work on low cholesterol diet. Encouraged exercise, weight loss, healthy eating habits.       Hypertension   Essentially resolved with weight loss!      Other Visit Diagnoses       Routine general medical examination at a health care facility    -  Primary         Body mass index is 33.68 kg/m.     Greig Ring, MD

## 2024-05-18 ENCOUNTER — Telehealth: Admitting: Physician Assistant

## 2024-05-18 DIAGNOSIS — R3989 Other symptoms and signs involving the genitourinary system: Secondary | ICD-10-CM

## 2024-05-18 MED ORDER — SULFAMETHOXAZOLE-TRIMETHOPRIM 800-160 MG PO TABS: 1.0000 | ORAL_TABLET | Freq: Two times a day (BID) | ORAL | 0 refills | Status: AC

## 2024-05-18 NOTE — Progress Notes (Signed)

## 2025-02-18 ENCOUNTER — Ambulatory Visit

## 2025-02-19 ENCOUNTER — Ambulatory Visit
